# Patient Record
Sex: Female | Born: 1995 | Race: Black or African American | Hispanic: No | Marital: Single | Smoking: Former smoker
Health system: Southern US, Community
[De-identification: ages and names within clinical notes are randomized; demographics above are authoritative.]

## PROBLEM LIST (undated history)

## (undated) DIAGNOSIS — S42413A Displaced simple supracondylar fracture without intercondylar fracture of unspecified humerus, initial encounter for closed fracture: Secondary | ICD-10-CM

## (undated) DIAGNOSIS — N83209 Unspecified ovarian cyst, unspecified side: Secondary | ICD-10-CM

## (undated) HISTORY — PX: NO PAST SURGERIES: SHX2092

---
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
## 2015-02-20 ENCOUNTER — Emergency Department (HOSPITAL_BASED_OUTPATIENT_CLINIC_OR_DEPARTMENT_OTHER)
Admission: EM | Admit: 2015-02-20 | Discharge: 2015-02-20 | Disposition: A | Discharge status: Home / Self Care | Payer: Medicaid Other | Attending: Emergency Medicine | Admitting: Emergency Medicine

## 2015-02-20 ENCOUNTER — Encounter (HOSPITAL_BASED_OUTPATIENT_CLINIC_OR_DEPARTMENT_OTHER): Payer: Self-pay | Admitting: Emergency Medicine

## 2015-02-20 DIAGNOSIS — K088 Other specified disorders of teeth and supporting structures: Secondary | ICD-10-CM | POA: Diagnosis present

## 2015-02-20 DIAGNOSIS — K029 Dental caries, unspecified: Secondary | ICD-10-CM | POA: Diagnosis not present

## 2015-02-20 MED ORDER — PENICILLIN V POTASSIUM 250 MG PO TABS
500.0000 mg | ORAL_TABLET | Freq: Once | ORAL | Status: AC
  Administered 2015-02-20: 500 mg via ORAL
  Filled 2015-02-20: qty 2

## 2015-02-20 MED ORDER — PENICILLIN V POTASSIUM 500 MG PO TABS: 500.0000 mg | ORAL_TABLET | Freq: Four times a day (QID) | ORAL | Status: AC

## 2015-02-20 MED ORDER — MELOXICAM 15 MG PO TABS: 15.0000 mg | ORAL_TABLET | Freq: Every day | ORAL | Status: DC

## 2015-02-20 NOTE — ED Notes (Signed)
Pt verbalizes understanding of d/c instructions and denies any further needs at this time. 

## 2015-02-20 NOTE — Discharge Instructions (Signed)
Dental Caries °Dental caries is tooth decay. This decay can cause a hole in teeth (cavity) that can get bigger and deeper over time. °HOME CARE °· Brush and floss your teeth. Do this at least two times a day. °· Use a fluoride toothpaste. °· Use a mouth rinse if told by your dentist or doctor. °· Eat less sugary and starchy foods. Drink less sugary drinks. °· Avoid snacking often on sugary and starchy foods. Avoid sipping often on sugary drinks. °· Keep regular checkups and cleanings with your dentist. °· Use fluoride supplements if told by your dentist or doctor. °· Allow fluoride to be applied to teeth if told by your dentist or doctor. °Document Released: 02/16/2008 Document Revised: 09/23/2013 Document Reviewed: 05/11/2012 °[employer]® Patient Information ©2015 [employer]. This information is not intended to replace advice given to you by your health care provider. Make sure you discuss any questions you have with your health care provider. ° °

## 2015-02-20 NOTE — ED Provider Notes (Signed)
CSN: [id number]     Arrival date & time 02/20/15  0443 History   First MD Initiated Contact with Patient 02/20/15 [phone]     Chief Complaint  Patient presents with  . Dental Pain     (Consider location/radiation/quality/duration/timing/severity/associated sxs/prior Treatment) Patient is a 19 y.o. female presenting with tooth pain. The history is provided by the patient.  Dental Pain Location:  Lower Lower teeth location:  20/LL 2nd bicuspid Quality:  Dull Severity:  Moderate Onset quality:  Gradual Timing:  Constant Progression:  Unchanged Chronicity:  Recurrent Context: dental caries   Context comment:  Filling cracked  Previous work-up:  Filled cavity Relieved by:  Nothing Worsened by:  Nothing tried Ineffective treatments:  None tried Associated symptoms: no congestion, no fever, no neck swelling and no trismus     History reviewed. No pertinent past medical history. History reviewed. No pertinent past surgical history. Family History  Problem Relation Age of Onset  . Hyperlipidemia Mother   . Hypertension Mother   . Diabetes Maternal Grandmother   . Hyperlipidemia Maternal Grandmother   . Hypertension Maternal Grandmother   . Mental retardation Maternal Grandmother   . Hypertension Maternal Grandfather    Social History  Substance Use Topics  . Smoking status: Never Smoker   . Smokeless tobacco: None  . Alcohol Use: No   OB History    No data available     Review of Systems  Constitutional: Negative for fever.  HENT: Negative for congestion.   All other systems reviewed and are negative.     Allergies  Nickel  Home Medications   Prior to Admission medications   Medication Sig Start Date End Date Taking? Authorizing Provider  meloxicam (MOBIC) 15 MG tablet Take 1 tablet (15 mg total) by mouth daily. 02/20/15   April Palumbo, MD  penicillin v potassium (VEETID) 500 MG tablet Take 1 tablet (500 mg total) by mouth 4 (four) times daily. 02/20/15 02/27/15   April Palumbo, MD   BP 121/77 mmHg  Pulse 70  Temp(Src) 98.3 F (36.8 C) (Oral)  Resp 18  Ht  (1.676 m)  Wt 175 lb (79.379 kg)  BMI 28.26 kg/m2  SpO2 100%  LMP 02/05/2015 Physical Exam  Constitutional: She is oriented to person, place, and time. She appears well-developed and well-nourished. No distress.  HENT:  Head: Normocephalic and atraumatic.  Mouth/Throat: Oropharynx is clear and moist. No trismus in the jaw. Dental caries present.  Eyes: Conjunctivae are normal. Pupils are equal, round, and reactive to light.  Neck: Normal range of motion. Neck supple.  Cardiovascular: Normal rate, regular rhythm and intact distal pulses.   Pulmonary/Chest: Effort normal and breath sounds normal. She has no wheezes. She has no rales.  Abdominal: Soft. Bowel sounds are normal. There is no tenderness. There is no rebound and no guarding.  Musculoskeletal: Normal range of motion.  Neurological: She is alert and oriented to person, place, and time.  Skin: Skin is warm and dry.  Psychiatric: She has a normal mood and affect.    ED Course  Procedures (including critical care time) Labs Review Labs Reviewed - No data to display  Imaging Review No results found. I have personally reviewed and evaluated these images and lab results as part of my medical decision-making.   EKG Interpretation None      MDM   Final diagnoses:  Dental caries    Penicillin and mobic and follow up with your dentist for panorex and refill of tooth  Cy Blamer, MD 02/20/15 [phone]

## 2015-02-20 NOTE — ED Notes (Signed)
Sharp pains to lower left molars for the last month, getting worse over the last two days.  States her filling is broken.

## 2016-06-24 ENCOUNTER — Emergency Department (HOSPITAL_BASED_OUTPATIENT_CLINIC_OR_DEPARTMENT_OTHER): Payer: Medicaid Other

## 2016-06-24 ENCOUNTER — Encounter (HOSPITAL_BASED_OUTPATIENT_CLINIC_OR_DEPARTMENT_OTHER): Payer: Self-pay | Admitting: *Deleted

## 2016-06-24 ENCOUNTER — Emergency Department (HOSPITAL_BASED_OUTPATIENT_CLINIC_OR_DEPARTMENT_OTHER)
Admission: EM | Admit: 2016-06-24 | Discharge: 2016-06-24 | Disposition: A | Discharge status: Home / Self Care | Payer: Medicaid Other | Attending: Emergency Medicine | Admitting: Emergency Medicine

## 2016-06-24 DIAGNOSIS — S0993XA Unspecified injury of face, initial encounter: Secondary | ICD-10-CM

## 2016-06-24 DIAGNOSIS — W108XXA Fall (on) (from) other stairs and steps, initial encounter: Secondary | ICD-10-CM | POA: Insufficient documentation

## 2016-06-24 DIAGNOSIS — S0181XA Laceration without foreign body of other part of head, initial encounter: Secondary | ICD-10-CM | POA: Insufficient documentation

## 2016-06-24 DIAGNOSIS — Y929 Unspecified place or not applicable: Secondary | ICD-10-CM | POA: Insufficient documentation

## 2016-06-24 DIAGNOSIS — Y939 Activity, unspecified: Secondary | ICD-10-CM | POA: Insufficient documentation

## 2016-06-24 DIAGNOSIS — Z23 Encounter for immunization: Secondary | ICD-10-CM | POA: Insufficient documentation

## 2016-06-24 DIAGNOSIS — Y999 Unspecified external cause status: Secondary | ICD-10-CM | POA: Insufficient documentation

## 2016-06-24 DIAGNOSIS — S025XXA Fracture of tooth (traumatic), initial encounter for closed fracture: Secondary | ICD-10-CM | POA: Insufficient documentation

## 2016-06-24 MED ORDER — TETANUS-DIPHTH-ACELL PERTUSSIS 5-2.5-18.5 LF-MCG/0.5 IM SUSP
0.5000 mL | Freq: Once | INTRAMUSCULAR | Status: AC
  Administered 2016-06-24: 0.5 mL via INTRAMUSCULAR
  Filled 2016-06-24: qty 0.5

## 2016-06-24 NOTE — ED Triage Notes (Signed)
Pt reports that she fell and hit her face on concrete stairs about 1 hour PTA.  Noted to have a chipped right front tooth, denies pain.  Reports pain in left front tooth.  Noted to have a 1cm lac to forehead, bleeding controlled.  Denies LOC.

## 2016-06-24 NOTE — Discharge Instructions (Signed)
Dermabond will slough off over the next few days, try not to pull it off. Can use mederma once would is healed to help reduce scarring. Unfortunately, there is not a dentist on call today.  You can try to find another one, or wait to see your dentist on Wednesday. Return here for new concerns.

## 2016-06-24 NOTE — ED Provider Notes (Signed)
[facility] Note   CSN: [id number] Arrival date & time: 06/24/16  0848     History   Chief Complaint Chief Complaint  Patient presents with  . Fall    HPI Julia Mcknight is a 21 y.o. female.  The history is provided by the patient and medical records.  Fall    21 year old female here after fall. Reports she was going up some steps and not paying attention causing her to slip. She hit her head on the steps as well as her face. No loss of consciousness. Patient states she sustained a small laceration to her forehead and chipped her right front tooth. States her other front tooth does feel somewhat loose and she has some pain along her gums. She denies any headache, confusion, numbness, weakness, or difficulty walking. She was able to drive herself to the ER today. Date of last tetanus unknown. She is established with dentist in [place].  History reviewed. No pertinent past medical history.  There are no active problems to display for this patient.   History reviewed. No pertinent surgical history.  OB History    No data available       Home Medications    Prior to Admission medications   Not on File    Family History Family History  Problem Relation Age of Onset  . Hyperlipidemia Mother   . Hypertension Mother   . Diabetes Maternal Grandmother   . Hyperlipidemia Maternal Grandmother   . Hypertension Maternal Grandmother   . Mental retardation Maternal Grandmother   . Hypertension Maternal Grandfather     Social History Social History  Substance Use Topics  . Smoking status: Never Smoker  . Smokeless tobacco: Not on file  . Alcohol use No     Allergies   Nickel   Review of Systems Review of Systems  HENT: Positive for dental problem.   Skin: Positive for wound.  All other systems reviewed and are negative.    Physical Exam Updated Vital Signs BP 113/81 (BP Location: Left Arm)   Pulse 70   Temp 98.2 F (36.8 C)  (Oral)   Resp 16   Ht 5\' 5"  (1.651 m)   Wt 80.3 kg   LMP 06/13/2016   SpO2 100%   BMI 29.45 kg/m   Physical Exam  Constitutional: She is oriented to person, place, and time. She appears well-developed and well-nourished.  HENT:  Head: Normocephalic and atraumatic.  Right Ear: Tympanic membrane normal.  Left Ear: Tympanic membrane normal.  Nose: Nose normal.  Mouth/Throat: Oropharynx is clear and moist.  Right upper central incisor is broken in half horizontally; remaining teeth normal in appearance, some tenderness of surrounding gingiva, no bleeding, no tongue laceration, no trismus, able to open mouth 3+ finger widths  1cm superficial laceration over the central forehead, there is no bleeding, skin remains very well approximated; no surrounding hematoma or skull depression  Eyes: Conjunctivae and EOM are normal. Pupils are equal, round, and reactive to light.  Neck: Normal range of motion.  Cardiovascular: Normal rate, regular rhythm and normal heart sounds.   No murmur heard. Pulmonary/Chest: Effort normal and breath sounds normal. No respiratory distress. She has no wheezes. She has no rhonchi.  Abdominal: Soft. Bowel sounds are normal. There is no tenderness. There is no rebound.  Musculoskeletal: Normal range of motion.       Cervical back: Normal.  Neurological: She is alert and oriented to person, place, and time.  AAOx3, answering questions and  following commands appropriately; equal strength UE and LE bilaterally; CN grossly intact; moves all extremities appropriately without ataxia; no focal neuro deficits or facial asymmetry appreciated  Skin: Skin is warm and dry.  Psychiatric: She has a normal mood and affect.  Nursing note and vitals reviewed.    ED Treatments / Results  Labs (all labs ordered are listed, but only abnormal results are displayed) Labs Reviewed - No data to display  EKG  EKG Interpretation None       Radiology Ct Maxillofacial Wo  Contrast  Result Date: 06/24/2016 CLINICAL DATA:  Pt tripped and fell on stairs, broken front teeth, jaw pain EXAM: CT MAXILLOFACIAL WITHOUT CONTRAST TECHNIQUE: Multidetector CT imaging of the maxillofacial structures was performed. Multiplanar CT image reconstructions were also generated. A small metallic BB was placed on the right temple in order to reliably differentiate right from left. COMPARISON:  None. FINDINGS: Osseous: No fracture of the orbital walls. Intraconal contents are normal. No maxillary bone fracture noted. Small of fluid within the RIGHT maxillary sinus is favored inflammatory. Pterygoid plates are normal. No nasal bone fracture. The mandibular condyles are intact.  No mandibular fracture present. There is a fracture of the RIGHT upper central incisor. Orbits: Normal Sinuses: Small of fluid RIGHT maxillary sinus Soft tissues: Normal Limited intracranial: Unremarkable IMPRESSION: 1. No facial bone fracture. 2. Fractured tooth:  RIGHT upper central incisor. Electronically Signed   By: Genevive BiStewart  Edmunds M.D.   On: 06/24/2016 10:21    Procedures Procedures (including critical care time)  LACERATION REPAIR Performed by: Garlon HatchetSANDERS, Londynn Sonoda M Authorized by: Garlon HatchetSANDERS, Immaculate Crutcher M Consent: Verbal consent obtained. Risks and benefits: risks, benefits and alternatives were discussed Consent given by: patient Patient identity confirmed: provided demographic data Prepped and Draped in normal sterile fashion Wound explored  Laceration Location: central forhead  Laceration Length: 1cm  No Foreign Bodies seen or palpated  Anesthesia: none  Local anesthetic: none  Anesthetic total: 0 ml  Irrigation method: syringe Amount of cleaning: standard  Skin closure: dermabond  Number of sutures: 0  Technique: surgical glue  Patient tolerance: Patient tolerated the procedure well with no immediate complications.   Medications Ordered in ED Medications  Tdap (BOOSTRIX) injection 0.5 mL (0.5  mLs Intramuscular Given 06/24/16 1009)     Initial Impression / Assessment and Plan / ED Course  I have reviewed the triage vital signs and the nursing notes.  Pertinent labs & imaging results that were available during my care of the patient were reviewed by me and considered in my medical decision making (see chart for details).  21 y.o. F here with fall and subsequent head laceration and broken tooth.  No LOC.  Remains AAOx3 here without noted deficits.  Superficial 1cm laceration noted to forehead as well as broken right upper central incisor.  CT max/face without evidence of underlying jaw or facial fracture. Laceration repaired with dermabond.  Tetanus updated.  Will have her follow-up with dentist regarding fractured tooth.  Discussed plan with patient, she acknowledged understanding and agreed with plan of care.  Return precautions given for new or worsening symptoms.  Final Clinical Impressions(s) / ED Diagnoses   Final diagnoses:  Dental injury, initial encounter  Forehead laceration, initial encounter    New Prescriptions New Prescriptions   No medications on file     Garlon HatchetLisa M Piper Hassebrock, PA-C 06/24/16 1104    Vanetta MuldersScott Zackowski, MD 06/30/16 [phone]

## 2016-09-15 ENCOUNTER — Encounter (HOSPITAL_BASED_OUTPATIENT_CLINIC_OR_DEPARTMENT_OTHER): Payer: Self-pay | Admitting: *Deleted

## 2016-09-15 ENCOUNTER — Emergency Department (HOSPITAL_BASED_OUTPATIENT_CLINIC_OR_DEPARTMENT_OTHER)
Admission: EM | Admit: 2016-09-15 | Discharge: 2016-09-15 | Disposition: A | Discharge status: Home / Self Care | Payer: Self-pay | Attending: Emergency Medicine | Admitting: Emergency Medicine

## 2016-09-15 ENCOUNTER — Emergency Department (HOSPITAL_BASED_OUTPATIENT_CLINIC_OR_DEPARTMENT_OTHER): Payer: Self-pay

## 2016-09-15 DIAGNOSIS — R319 Hematuria, unspecified: Secondary | ICD-10-CM

## 2016-09-15 DIAGNOSIS — N83201 Unspecified ovarian cyst, right side: Secondary | ICD-10-CM | POA: Insufficient documentation

## 2016-09-15 DIAGNOSIS — N390 Urinary tract infection, site not specified: Secondary | ICD-10-CM | POA: Insufficient documentation

## 2016-09-15 LAB — COMPREHENSIVE METABOLIC PANEL
ALT: 14 U/L (ref 14–54)
ANION GAP: 6 (ref 5–15)
AST: 20 U/L (ref 15–41)
Albumin: 4 g/dL (ref 3.5–5.0)
Alkaline Phosphatase: 61 U/L (ref 38–126)
BUN: 9 mg/dL (ref 6–20)
CHLORIDE: 106 mmol/L (ref 101–111)
CO2: 25 mmol/L (ref 22–32)
Calcium: 9 mg/dL (ref 8.9–10.3)
Creatinine, Ser: 0.67 mg/dL (ref 0.44–1.00)
Glucose, Bld: 96 mg/dL (ref 65–99)
POTASSIUM: 3.8 mmol/L (ref 3.5–5.1)
SODIUM: 137 mmol/L (ref 135–145)
Total Bilirubin: 0.3 mg/dL (ref 0.3–1.2)
Total Protein: 7.4 g/dL (ref 6.5–8.1)

## 2016-09-15 LAB — URINALYSIS, ROUTINE W REFLEX MICROSCOPIC
BILIRUBIN URINE: NEGATIVE
Glucose, UA: NEGATIVE mg/dL
KETONES UR: NEGATIVE mg/dL
NITRITE: NEGATIVE
Protein, ur: NEGATIVE mg/dL
SPECIFIC GRAVITY, URINE: 1.019 (ref 1.005–1.030)
pH: 7 (ref 5.0–8.0)

## 2016-09-15 LAB — URINALYSIS, MICROSCOPIC (REFLEX)

## 2016-09-15 LAB — CBC WITH DIFFERENTIAL/PLATELET
Basophils Absolute: 0 10*3/uL (ref 0.0–0.1)
Basophils Relative: 0 %
EOS ABS: 0.1 10*3/uL (ref 0.0–0.7)
Eosinophils Relative: 1 %
HCT: 36.8 % (ref 36.0–46.0)
Hemoglobin: 13.3 g/dL (ref 12.0–15.0)
LYMPHS ABS: 2 10*3/uL (ref 0.7–4.0)
LYMPHS PCT: 31 %
MCH: 32.5 pg (ref 26.0–34.0)
MCHC: 36.1 g/dL — ABNORMAL HIGH (ref 30.0–36.0)
MCV: 90 fL (ref 78.0–100.0)
MONO ABS: 0.6 10*3/uL (ref 0.1–1.0)
Monocytes Relative: 9 %
Neutro Abs: 3.7 10*3/uL (ref 1.7–7.7)
Neutrophils Relative %: 59 %
PLATELETS: 216 10*3/uL (ref 150–400)
RBC: 4.09 MIL/uL (ref 3.87–5.11)
RDW: 11.5 % (ref 11.5–15.5)
WBC: 6.3 10*3/uL (ref 4.0–10.5)

## 2016-09-15 LAB — LIPASE, BLOOD: LIPASE: 23 U/L (ref 11–51)

## 2016-09-15 LAB — PREGNANCY, URINE: PREG TEST UR: NEGATIVE

## 2016-09-15 MED ORDER — MECLIZINE HCL 25 MG PO TABS: 25.0000 mg | ORAL_TABLET | Freq: Three times a day (TID) | ORAL | 0 refills | Status: DC | PRN

## 2016-09-15 MED ORDER — NAPROXEN 375 MG PO TABS: 375.0000 mg | ORAL_TABLET | Freq: Two times a day (BID) | ORAL | 0 refills | Status: DC

## 2016-09-15 MED ORDER — NITROFURANTOIN MONOHYD MACRO 100 MG PO CAPS: 100.0000 mg | ORAL_CAPSULE | Freq: Two times a day (BID) | ORAL | 0 refills | Status: DC

## 2016-09-15 NOTE — ED Triage Notes (Signed)
Pt c/o diffuse abd pain x 2 days  

## 2016-09-15 NOTE — Discharge Instructions (Signed)
Contact a health care provider if: °· You have back pain. °· You have a fever. °· You feel nauseous or vomit. °· Your symptoms do not get better after 3 days. °· Your symptoms go away and then return. ° °

## 2016-09-15 NOTE — ED Provider Notes (Signed)
[facility] Provider Note   CSN: [id number] Arrival date & time: 09/15/16  1558  By signing my name below, I, [name], attest that this documentation has been prepared under the direction and in the presence of non-physician practitioner, Arthor Captain, PA-C. Electronically Signed: [name], Scribe. 09/15/2016. 4:44 PM.  History   Chief Complaint Chief Complaint  Patient presents with  . Abdominal Pain   The history is provided by the patient. No language interpreter was used.   HPI Comments: Julia Mcknight is a 21 y.o. female who presents to the Emergency Department complaining of constant moderate RLQ abdominal pain that started about 2 days ago. She was diagnosed with an ovarian cyst 2 months ago. She describes the pain as a 6/10 and worse at night. She reports associated nausea, vomiting (twice this morning), upper left back pain, and breast tenderness (onset a week ago). LNMP was 08/04/16  She admits to a chance of pregnancy. Denies any urinary symptoms, diarrhea, constipation, or other complaints at this time.  History reviewed. No pertinent past medical history.  There are no active problems to display for this patient.   History reviewed. No pertinent surgical history.  OB History    No data available       Home Medications    Prior to Admission medications   Not on File    Family History Family History  Problem Relation Age of Onset  . Hyperlipidemia Mother   . Hypertension Mother   . Diabetes Maternal Grandmother   . Hyperlipidemia Maternal Grandmother   . Hypertension Maternal Grandmother   . Mental retardation Maternal Grandmother   . Hypertension Maternal Grandfather     Social History Social History  Substance Use Topics  . Smoking status: Never Smoker  . Smokeless tobacco: Not on file  . Alcohol use No     Allergies   Nickel   Review of Systems Review of Systems  All other systems reviewed and are negative for  acute change except as noted in the HPI.  Physical Exam Updated Vital Signs BP 126/90 (BP Location: Left Arm)   Pulse (!) 102   Temp 98.2 F (36.8 C)   Resp 16   Ht  (1.651 m)   Wt 178 lb (80.7 kg)   LMP 08/04/2016   SpO2 100%   BMI 29.62 kg/m   Physical Exam  Constitutional: She appears well-developed and well-nourished. No distress.  HENT:  Head: Normocephalic.  Eyes: Conjunctivae are normal.  Neck: Neck supple.  Cardiovascular: Normal rate and regular rhythm.   Pulmonary/Chest: Effort normal. No respiratory distress. She has no wheezes. She has no rales.  Abdominal: Soft. Bowel sounds are normal. There is tenderness. There is no rebound and no guarding.  SP TTP.   Genitourinary:  Genitourinary Comments: No CVA tenderness.   Musculoskeletal: Normal range of motion.  Neurological: She is alert.  Skin: Skin is warm and dry.  Psychiatric: She has a normal mood and affect.  Nursing note and vitals reviewed.    ED Treatments / Results  DIAGNOSTIC STUDIES: Oxygen Saturation is 100% on RA, normal by my interpretation.    COORDINATION OF CARE: 4:48 PM- Pt advised of plan for treatment and pt agrees.  Labs (all labs ordered are listed, but only abnormal results are displayed) Labs Reviewed  CBC WITH DIFFERENTIAL/PLATELET - Abnormal; Notable for the following:       Result Value   MCHC 36.1 (*)    All other components within normal  limits  URINALYSIS, ROUTINE W REFLEX MICROSCOPIC - Abnormal; Notable for the following:    APPearance CLOUDY (*)    Hgb urine dipstick SMALL (*)    Leukocytes, UA MODERATE (*)    All other components within normal limits  URINALYSIS, MICROSCOPIC (REFLEX) - Abnormal; Notable for the following:    Bacteria, UA FEW (*)    Squamous Epithelial / LPF 6-30 (*)    All other components within normal limits  PREGNANCY, URINE  COMPREHENSIVE METABOLIC PANEL  LIPASE, BLOOD    EKG  EKG Interpretation None       Radiology No results  found.  Procedures Procedures (including critical care time)  Medications Ordered in ED Medications - No data to display   Initial Impression / Assessment and Plan / ED Course  I have reviewed the triage vital signs and the nursing notes.  Pertinent labs & imaging results that were available during my care of the patient were reviewed by me and considered in my medical decision making (see chart for details).     Pt has been diagnosed with a UTI. Pt is afebrile, no CVA tenderness, normotensive, and denies N/V.CT negative for stone. Will send to GYN for f/u on abnormal R ovarian cyst. Pt to be dc home with antibiotics and instructions to follow up with PCP if symptoms persist.   Final Clinical Impressions(s) / ED Diagnoses   Final diagnoses:  Urinary tract infection with hematuria, site unspecified  Cyst of right ovary    New Prescriptions New Prescriptions   No medications on file   I personally performed the services described in this documentation, which was scribed in my presence. The recorded information has been reviewed and is accurate.        Arthor Captain, PA-C 09/15/16 1739    Canary Brim Tegeler, MD 09/16/16 0111

## 2017-05-18 ENCOUNTER — Emergency Department (HOSPITAL_BASED_OUTPATIENT_CLINIC_OR_DEPARTMENT_OTHER)
Admission: EM | Admit: 2017-05-18 | Discharge: 2017-05-18 | Disposition: A | Discharge status: Home / Self Care | Payer: Self-pay | Attending: Emergency Medicine | Admitting: Emergency Medicine

## 2017-05-18 ENCOUNTER — Other Ambulatory Visit: Payer: Self-pay

## 2017-05-18 ENCOUNTER — Encounter (HOSPITAL_BASED_OUTPATIENT_CLINIC_OR_DEPARTMENT_OTHER): Payer: Self-pay | Admitting: *Deleted

## 2017-05-18 ENCOUNTER — Emergency Department (HOSPITAL_BASED_OUTPATIENT_CLINIC_OR_DEPARTMENT_OTHER): Payer: Self-pay

## 2017-05-18 DIAGNOSIS — N760 Acute vaginitis: Secondary | ICD-10-CM | POA: Insufficient documentation

## 2017-05-18 DIAGNOSIS — F17200 Nicotine dependence, unspecified, uncomplicated: Secondary | ICD-10-CM | POA: Insufficient documentation

## 2017-05-18 DIAGNOSIS — B9689 Other specified bacterial agents as the cause of diseases classified elsewhere: Secondary | ICD-10-CM

## 2017-05-18 DIAGNOSIS — N83201 Unspecified ovarian cyst, right side: Secondary | ICD-10-CM

## 2017-05-18 DIAGNOSIS — N83299 Other ovarian cyst, unspecified side: Secondary | ICD-10-CM | POA: Insufficient documentation

## 2017-05-18 DIAGNOSIS — N83202 Unspecified ovarian cyst, left side: Secondary | ICD-10-CM

## 2017-05-18 LAB — URINALYSIS, ROUTINE W REFLEX MICROSCOPIC
Bilirubin Urine: NEGATIVE
GLUCOSE, UA: NEGATIVE mg/dL
Hgb urine dipstick: NEGATIVE
Ketones, ur: NEGATIVE mg/dL
LEUKOCYTES UA: NEGATIVE
NITRITE: NEGATIVE
PH: 7 (ref 5.0–8.0)
PROTEIN: NEGATIVE mg/dL
Specific Gravity, Urine: 1.015 (ref 1.005–1.030)

## 2017-05-18 LAB — CBC WITH DIFFERENTIAL/PLATELET
BASOS ABS: 0 10*3/uL (ref 0.0–0.1)
BASOS PCT: 0 %
EOS PCT: 1 %
Eosinophils Absolute: 0.1 10*3/uL (ref 0.0–0.7)
HCT: 37.4 % (ref 36.0–46.0)
Hemoglobin: 13 g/dL (ref 12.0–15.0)
Lymphocytes Relative: 21 %
Lymphs Abs: 1.5 10*3/uL (ref 0.7–4.0)
MCH: 31.5 pg (ref 26.0–34.0)
MCHC: 34.8 g/dL (ref 30.0–36.0)
MCV: 90.6 fL (ref 78.0–100.0)
MONO ABS: 0.4 10*3/uL (ref 0.1–1.0)
Monocytes Relative: 6 %
NEUTROS ABS: 4.9 10*3/uL (ref 1.7–7.7)
Neutrophils Relative %: 72 %
PLATELETS: 207 10*3/uL (ref 150–400)
RBC: 4.13 MIL/uL (ref 3.87–5.11)
RDW: 12 % (ref 11.5–15.5)
WBC: 6.9 10*3/uL (ref 4.0–10.5)

## 2017-05-18 LAB — PREGNANCY, URINE: PREG TEST UR: NEGATIVE

## 2017-05-18 LAB — COMPREHENSIVE METABOLIC PANEL
ALBUMIN: 4.2 g/dL (ref 3.5–5.0)
ALT: 17 U/L (ref 14–54)
ANION GAP: 7 (ref 5–15)
AST: 15 U/L (ref 15–41)
Alkaline Phosphatase: 72 U/L (ref 38–126)
BUN: 9 mg/dL (ref 6–20)
CHLORIDE: 105 mmol/L (ref 101–111)
CO2: 25 mmol/L (ref 22–32)
Calcium: 8.9 mg/dL (ref 8.9–10.3)
Creatinine, Ser: 0.5 mg/dL (ref 0.44–1.00)
GFR calc Af Amer: >60 mL/min (ref >60)
GFR calc non Af Amer: >60 mL/min (ref >60)
GLUCOSE: 88 mg/dL (ref 65–99)
POTASSIUM: 3.9 mmol/L (ref 3.5–5.1)
Sodium: 137 mmol/L (ref 135–145)
Total Bilirubin: 0.6 mg/dL (ref 0.3–1.2)
Total Protein: 7.4 g/dL (ref 6.5–8.1)

## 2017-05-18 LAB — WET PREP, GENITAL
SPERM: NONE SEEN
Trich, Wet Prep: NONE SEEN
Yeast Wet Prep HPF POC: NONE SEEN

## 2017-05-18 LAB — LIPASE, BLOOD: Lipase: 22 U/L (ref 11–51)

## 2017-05-18 MED ORDER — IBUPROFEN 400 MG PO TABS
600.0000 mg | ORAL_TABLET | Freq: Once | ORAL | Status: AC
  Administered 2017-05-18: 13:00:00 600 mg via ORAL
  Filled 2017-05-18: qty 1

## 2017-05-18 MED ORDER — METRONIDAZOLE 500 MG PO TABS: 500.0000 mg | ORAL_TABLET | Freq: Two times a day (BID) | ORAL | 0 refills | Status: DC

## 2017-05-18 NOTE — ED Provider Notes (Signed)
[facility] EMERGENCY DEPARTMENT Provider Note   CSN: [id number] Arrival date & time: 05/18/17  1056     History   Chief Complaint Chief Complaint  Patient presents with  . Abdominal Pain    HPI  Julia Mcknight is a 21 y.o. Female no pertinent past medical history, presents complaining of intermittent lower abdominal pain for the past month.  Patient describes pain as intermittent sharp and cramping.  Reports pain comes and goes a few times a day, but is present every day, usually worse in the morning and then sometimes returns in the afternoon.  Patient reports some associated nausea, no episodes of vomiting, no fevers or chills, no diarrhea or constipation or blood in the stool.  No history of previous gastrointestinal problems or abdominal surgeries, patient has not seen anyone for this problem so far.  Reports she came in today because pain was getting worse.  Patient took 200 mg of ibuprofen this morning with some improvement.  Pt is sexually active, denies any abnormal vaginal bleeding or discharge, no pelvic pain.  Last menstrual period was on 04/01/17.      History reviewed. No pertinent past medical history.  There are no active problems to display for this patient.   History reviewed. No pertinent surgical history.  OB History    No data available       Home Medications    Prior to Admission medications   Medication Sig Start Date End Date Taking? Authorizing Provider  meclizine (ANTIVERT) 25 MG tablet Take 1 tablet (25 mg total) by mouth 3 (three) times daily as needed for nausea. 09/15/16   Arthor Captain, PA-C  naproxen (NAPROSYN) 375 MG tablet Take 1 tablet (375 mg total) by mouth 2 (two) times daily. 09/15/16   Arthor Captain, PA-C  nitrofurantoin, macrocrystal-monohydrate, (MACROBID) 100 MG capsule Take 1 capsule (100 mg total) by mouth 2 (two) times daily. 09/15/16   Arthor Captain, PA-C    Family History Family History  Problem Relation  Age of Onset  . Hyperlipidemia Mother   . Hypertension Mother   . Diabetes Maternal Grandmother   . Hyperlipidemia Maternal Grandmother   . Hypertension Maternal Grandmother   . Mental retardation Maternal Grandmother   . Hypertension Maternal Grandfather     Social History Social History   Tobacco Use  . Smoking status: Current Every Day Smoker    Packs/day: 0.50    Types: Cigars  . Smokeless tobacco: Never Used  Substance Use Topics  . Alcohol use: No  . Drug use: No     Allergies   Nickel   Review of Systems Review of Systems  Constitutional: Negative for chills and fever.  Respiratory: Negative for cough, chest tightness and shortness of breath.   Cardiovascular: Negative for chest pain.  Gastrointestinal: Positive for abdominal pain and nausea. Negative for blood in stool, constipation, diarrhea and vomiting.  Genitourinary: Negative for dysuria, flank pain, frequency, menstrual problem, pelvic pain, vaginal bleeding, vaginal discharge and vaginal pain.  Musculoskeletal: Negative for arthralgias and myalgias.  Skin: Negative for rash.  Neurological: Negative for light-headedness and headaches.  All other systems reviewed and are negative.    Physical Exam Updated Vital Signs BP 124/85 (BP Location: Right Arm)   Temp 98.4 F (36.9 C) (Oral)   Resp 18   Ht 5\' 5"  (1.651 m)   Wt 73.5 kg (162 lb)   LMP 04/01/2017 (Exact Date)   SpO2 100%   BMI 26.96 kg/m   Physical Exam  Constitutional: She is oriented to person, place, and time. She appears well-developed and well-nourished. No distress.  HENT:  Head: Normocephalic and atraumatic.  Eyes: Right eye exhibits no discharge. Left eye exhibits no discharge.  Neck: Neck supple.  Cardiovascular: Normal rate, regular rhythm, normal heart sounds and intact distal pulses.  Pulmonary/Chest: Effort normal and breath sounds normal. No stridor. No respiratory distress. She has no wheezes. She has no rales.  Abdominal:  Soft. Bowel sounds are normal. She exhibits no distension and no mass. There is tenderness. There is no rebound and no guarding.  Abdomen soft, generalized mild tendnerness w/o guarding, worse across lower abdomen, no rebound tenderness, neg murphy's sign, neg psoas and obturator signs, no CVA tenderness.  Genitourinary:  Genitourinary Comments: Chaperone present during pelvic exam. No external genital lesions. Small amount of white discharge present in vaginal vault, no vaginal lesion or erythema. No cervicitis, minimal CMT, tenderness and fullness on palp of left adnexa, no right adnexal tenderness  Neurological: She is alert and oriented to person, place, and time. Coordination normal.  Skin: Skin is warm and dry. Capillary refill takes less than 2 seconds. She is not diaphoretic.  Psychiatric: She has a normal mood and affect. Her behavior is normal.  Nursing note and vitals reviewed.    ED Treatments / Results  Labs (all labs ordered are listed, but only abnormal results are displayed) Labs Reviewed  WET PREP, GENITAL - Abnormal; Notable for the following components:      Result Value   Clue Cells Wet Prep HPF POC PRESENT (*)    WBC, Wet Prep HPF POC MANY (*)    All other components within normal limits  PREGNANCY, URINE  CBC WITH DIFFERENTIAL/PLATELET  COMPREHENSIVE METABOLIC PANEL  LIPASE, BLOOD  URINALYSIS, ROUTINE W REFLEX MICROSCOPIC  HIV ANTIBODY (ROUTINE TESTING)  RPR  GC/CHLAMYDIA PROBE AMP ([place]) NOT AT [facility]    EKG  EKG Interpretation None       Radiology US Transvaginal Non-ob  Result Date: 05/18/2017 CLINICAL DATA:  Left adnexal tenderness. EXAM: TRANSABDOMINAL AND TRANSVAGINAL ULTRASOUND OF PELVIS DOPPLER ULTRASOUND OF OVARIES TECHNIQUE: Both transabdominal and transvaginal ultrasound examinations of the pelvis were performed. Transabdominal technique was performed for global imaging of the pelvis including uterus, ovaries, adnexal regions, and pelvic  cul-de-sac. It was necessary to proceed with endovaginal exam following the transabdominal exam to visualize the endometrium and ovaries. Color and duplex Doppler ultrasound was utilized to evaluate blood flow to the ovaries. COMPARISON:  None. FINDINGS: Uterus Measurements: 7.0 x 3.1 x 4.4 cm. No fibroids or other mass visualized. Endometrium Thickness: 7.4 mm.  No focal abnormality visualized. Right ovary Measurements: 5.2 x 4.1 x 4.5 cm. Structure within the right ovary containing diffuse low level internal echoes measures 3.6 x 4.0 x 3.6 cm. Left ovary Measurements: 8.0 x 8.2 x 8.9 cm. There are 2 complex cystic structures within the left ovary/adnexa. The largest measures 6.5 x 8.2 x 8.6 cm. This contains diffuse low-level internal echoes. Smaller, adjacent more simple appearing cystic structure measures 1.5 x 2.7 x 2.9 cm. Pulsed Doppler evaluation of both ovaries demonstrates normal low-resistance arterial and venous waveforms. Other findings Small volume of free fluid noted.  Right-sided hydrosalpinx noted. IMPRESSION: 1. There is no evidence for ovarian torsion. 2. Large complex cystic structure in the left ovary with diffuse low level internal echoes is identified. Differential considerations include hemorrhagic cyst versus an endometrioma. Short-interval follow up ultrasound in 6-12 weeks is recommended, preferably during the week  following the patient's normal menses. 3. Smaller complex cyst, also containing diffuse low level internal echoes noted within the right ovary. Similar differential. Attention on follow-up imaging advised. 4. Right-sided hydrosalpinx. Etiology indeterminate. Clinical correlation for any clinical signs or symptoms of PID is advise. Followup as indicated. 5. Small volume free fluid within the pelvis. Electronically Signed   By: Taylor  Stroud M.Signa Kell.   On: 05/18/2017 14:51   [place] Pelvis Complete  Result Date: 05/18/2017 CLINICAL DATA:  Left adnexal tenderness. EXAM: TRANSABDOMINAL  AND TRANSVAGINAL ULTRASOUND OF PELVIS DOPPLER ULTRASOUND OF OVARIES TECHNIQUE: Both transabdominal and transvaginal ultrasound examinations of the pelvis were performed. Transabdominal technique was performed for global imaging of the pelvis including uterus, ovaries, adnexal regions, and pelvic cul-de-sac. It was necessary to proceed with endovaginal exam following the transabdominal exam to visualize the endometrium and ovaries. Color and duplex Doppler ultrasound was utilized to evaluate blood flow to the ovaries. COMPARISON:  None. FINDINGS: Uterus Measurements: 7.0 x 3.1 x 4.4 cm. No fibroids or other mass visualized. Endometrium Thickness: 7.4 mm.  No focal abnormality visualized. Right ovary Measurements: 5.2 x 4.1 x 4.5 cm. Structure within the right ovary containing diffuse low level internal echoes measures 3.6 x 4.0 x 3.6 cm. Left ovary Measurements: 8.0 x 8.2 x 8.9 cm. There are 2 complex cystic structures within the left ovary/adnexa. The largest measures 6.5 x 8.2 x 8.6 cm. This contains diffuse low-level internal echoes. Smaller, adjacent more simple appearing cystic structure measures 1.5 x 2.7 x 2.9 cm. Pulsed Doppler evaluation of both ovaries demonstrates normal low-resistance arterial and venous waveforms. Other findings Small volume of free fluid noted.  Right-sided hydrosalpinx noted. IMPRESSION: 1. There is no evidence for ovarian torsion. 2. Large complex cystic structure in the left ovary with diffuse low level internal echoes is identified. Differential considerations include hemorrhagic cyst versus an endometrioma. Short-interval follow up ultrasound in 6-12 weeks is recommended, preferably during the week following the patient's normal menses. 3. Smaller complex cyst, also containing diffuse low level internal echoes noted within the right ovary. Similar differential. Attention on follow-up imaging advised. 4. Right-sided hydrosalpinx. Etiology indeterminate. Clinical correlation for any  clinical signs or symptoms of PID is advise. Followup as indicated. 5. Small volume free fluid within the pelvis. Electronically Signed   By: Signa Kellaylor  Stroud M.D.   On: 05/18/2017 14:51   [place] Art/ven Flow Abd Pelv Doppler  Result Date: 05/18/2017 CLINICAL DATA:  Left adnexal tenderness. EXAM: TRANSABDOMINAL AND TRANSVAGINAL ULTRASOUND OF PELVIS DOPPLER ULTRASOUND OF OVARIES TECHNIQUE: Both transabdominal and transvaginal ultrasound examinations of the pelvis were performed. Transabdominal technique was performed for global imaging of the pelvis including uterus, ovaries, adnexal regions, and pelvic cul-de-sac. It was necessary to proceed with endovaginal exam following the transabdominal exam to visualize the endometrium and ovaries. Color and duplex Doppler ultrasound was utilized to evaluate blood flow to the ovaries. COMPARISON:  None. FINDINGS: Uterus Measurements: 7.0 x 3.1 x 4.4 cm. No fibroids or other mass visualized. Endometrium Thickness: 7.4 mm.  No focal abnormality visualized. Right ovary Measurements: 5.2 x 4.1 x 4.5 cm. Structure within the right ovary containing diffuse low level internal echoes measures 3.6 x 4.0 x 3.6 cm. Left ovary Measurements: 8.0 x 8.2 x 8.9 cm. There are 2 complex cystic structures within the left ovary/adnexa. The largest measures 6.5 x 8.2 x 8.6 cm. This contains diffuse low-level internal echoes. Smaller, adjacent more simple appearing cystic structure measures 1.5 x 2.7 x 2.9 cm. Pulsed Doppler evaluation  of both ovaries demonstrates normal low-resistance arterial and venous waveforms. Other findings Small volume of free fluid noted.  Right-sided hydrosalpinx noted. IMPRESSION: 1. There is no evidence for ovarian torsion. 2. Large complex cystic structure in the left ovary with diffuse low level internal echoes is identified. Differential considerations include hemorrhagic cyst versus an endometrioma. Short-interval follow up ultrasound in 6-12 weeks is recommended,  preferably during the week following the patient's normal menses. 3. Smaller complex cyst, also containing diffuse low level internal echoes noted within the right ovary. Similar differential. Attention on follow-up imaging advised. 4. Right-sided hydrosalpinx. Etiology indeterminate. Clinical correlation for any clinical signs or symptoms of PID is advise. Followup as indicated. 5. Small volume free fluid within the pelvis. Electronically Signed   By: Signa Kellaylor  Stroud M.D.   On: 05/18/2017 14:51    Procedures Procedures (including critical care time)  Medications Ordered in ED Medications  ibuprofen (ADVIL,MOTRIN) tablet 600 mg (600 mg Oral Given 05/18/17 1311)     Initial Impression / Assessment and Plan / ED Course  I have reviewed the triage vital signs and the nursing notes.  Pertinent labs & imaging results that were available during my care of the patient were reviewed by me and considered in my medical decision making (see chart for details).  Pt presents complaining of 1 month of intermittent lower abdominal pain with some associated nausea, no vomiting, diarrhea or fevers. Denies vaginal pain/discharge. On exam vitals are normal and pt is well-appearing. Abdomen with generalized tenderness slightly worse across lower abdomen, no focal tenderness or peritoneal signs. Plan for labs and pelvic exam.  Negative urine preg. Labs show no leukocytosis, hgb normal, no electrolyte derangements, kidney and liver function normal, lipase normal. UA w/o signs of infection. Pelvic with small amnt of white discharge, mild CMT, left adnexal tenderness and fullness on palp, no right adnexal tenderness. Wet prep w/ clue cells, STI testing pending. Will get ultrasound given adnexal fullness.  Pelvic US shows complex left orvarian cystic structure, smaller complex cyst on right ovary, there is questionable hydrosalpinx on irght side, but pt without right sided adnexal tenderness.  Case discussed with  OB/GYN Dr. Candelaria CelesteJacob Stinson, who recommends treating for BV and having pt follow up with GYN regarding cysts. He does not feel that pt should be treated for PID after reviewing U/S report and pt presentation. He recommends waiting for GC/Chlamydia cultures to return, given lack of fever or right sided adnexal tenderness, unlikely PID.  Pt is stable for discharge home and follow up with OB/GYN, regarding ovarian cysts, [facility]. Flagyl for BV, counseled pt on avoiding alcohol while taking this medication. Pt aware she has STI testing pending and will be called with any positive results. Strict return precautions discussed, Pt expresses understanding and agrees with plan.   Final Clinical Impressions(s) / ED Diagnoses   Final diagnoses:  Bilateral ovarian cysts  BV (bacterial vaginosis)    ED Discharge Orders        Ordered    metroNIDAZOLE (FLAGYL) 500 MG tablet  2 times daily     05/18/17 1610       Dartha LodgeFord, Jenisis Harmsen N, [place]PA-C 05/18/17 2329    Dartha LodgeFord, [place] Staheli N, PA-C 05/19/17 [id number]    Raeford RazorKohut, Stephen, MD 05/19/17 [phone]

## 2017-05-18 NOTE — Discharge Instructions (Signed)
Your pain is very likely due to bilateral ovarian cysts, these need to be followed up with repeat ultrasound, please call to schedule [facility]  Wet prep shows bacterial vaginosis, this will be treated with 1 week of Flagyl, complete full course of antibiotics, do not drink alcohol while taking this.  If you develop worsening abdominal pain, fevers, chills, or worsened vaginal discharge return for sooner reevaluation. ou will be contacted in the next few days if any of your STI results come back positive.

## 2017-05-18 NOTE — ED Triage Notes (Signed)
Pt has had lower abdominal pain for about a month.  Her pain is now unbearable interfers with walking and everyday functions now.

## 2017-05-18 NOTE — ED Notes (Signed)
NAD at this time. Pt is stable and going home.  

## 2017-05-19 LAB — HIV ANTIBODY (ROUTINE TESTING W REFLEX): HIV SCREEN 4TH GENERATION: NONREACTIVE

## 2017-05-19 LAB — GC/CHLAMYDIA PROBE AMP (~~LOC~~) NOT AT ARMC
Chlamydia: NEGATIVE
NEISSERIA GONORRHEA: NEGATIVE

## 2017-05-19 LAB — SYPHILIS: RPR W/REFLEX TO RPR TITER AND TREPONEMAL ANTIBODIES, TRADITIONAL SCREENING AND DIAGNOSIS ALGORITHM: RPR Ser Ql: NONREACTIVE

## 2018-01-08 ENCOUNTER — Encounter (HOSPITAL_BASED_OUTPATIENT_CLINIC_OR_DEPARTMENT_OTHER): Payer: Self-pay | Admitting: Emergency Medicine

## 2018-01-08 ENCOUNTER — Emergency Department (HOSPITAL_BASED_OUTPATIENT_CLINIC_OR_DEPARTMENT_OTHER)
Admission: EM | Admit: 2018-01-08 | Discharge: 2018-01-08 | Disposition: A | Discharge status: Home / Self Care | Payer: Self-pay | Attending: Emergency Medicine | Admitting: Emergency Medicine

## 2018-01-08 ENCOUNTER — Other Ambulatory Visit: Payer: Self-pay

## 2018-01-08 DIAGNOSIS — Y939 Activity, unspecified: Secondary | ICD-10-CM | POA: Insufficient documentation

## 2018-01-08 DIAGNOSIS — Y929 Unspecified place or not applicable: Secondary | ICD-10-CM | POA: Insufficient documentation

## 2018-01-08 DIAGNOSIS — S91332A Puncture wound without foreign body, left foot, initial encounter: Secondary | ICD-10-CM | POA: Insufficient documentation

## 2018-01-08 DIAGNOSIS — F17210 Nicotine dependence, cigarettes, uncomplicated: Secondary | ICD-10-CM | POA: Insufficient documentation

## 2018-01-08 DIAGNOSIS — W450XXA Nail entering through skin, initial encounter: Secondary | ICD-10-CM | POA: Insufficient documentation

## 2018-01-08 DIAGNOSIS — Z23 Encounter for immunization: Secondary | ICD-10-CM | POA: Insufficient documentation

## 2018-01-08 DIAGNOSIS — Z79899 Other long term (current) drug therapy: Secondary | ICD-10-CM | POA: Insufficient documentation

## 2018-01-08 DIAGNOSIS — Y999 Unspecified external cause status: Secondary | ICD-10-CM | POA: Insufficient documentation

## 2018-01-08 MED ORDER — TETANUS-DIPHTH-ACELL PERTUSSIS 5-2.5-18.5 LF-MCG/0.5 IM SUSP
INTRAMUSCULAR | Status: AC
  Administered 2018-01-08: 0.5 mL via INTRAMUSCULAR
  Filled 2018-01-08: qty 0.5

## 2018-01-08 MED ORDER — TETANUS-DIPHTH-ACELL PERTUSSIS 5-2.5-18.5 LF-MCG/0.5 IM SUSP
0.5000 mL | Freq: Once | INTRAMUSCULAR | Status: AC
  Administered 2018-01-08: 0.5 mL via INTRAMUSCULAR

## 2018-01-08 MED ORDER — CIPROFLOXACIN HCL 750 MG PO TABS: 750.0000 mg | ORAL_TABLET | Freq: Two times a day (BID) | ORAL | 0 refills | Status: DC

## 2018-01-08 NOTE — ED Triage Notes (Signed)
Pt reports puncture wound to LT foot this am when she stepped on nail that was sticking up from a pallet board

## 2018-01-08 NOTE — Discharge Instructions (Addendum)
Thank you for allowing me to care for you today in the Emergency Department.   Take 1 tablet of ciprofloxacin 2 times daily for the next 5 days.  To care for your foot at home, keep the area clean by washing the area at least 1 time daily with warm water and soap.  You can wear a Band-Aid over the area until it heals.  Avoid soaking or submerging the foot until the wound heals completely.  Try to wear shoes and socks with breathable material to avoid sweating.  Moisture and sweat can put you at higher risk for infection.  Signs of infection include if you develop fever, chills, or if the area gets red, hot to the touch, or swollen.  You should return to the emergency department if you develop symptoms like this.  Your Tdap immunization was updated today.

## 2018-01-08 NOTE — ED Provider Notes (Signed)
[facility] EMERGENCY DEPARTMENT Provider Note   CSN: [id number] Arrival date & time: 01/08/18  0913     History   Chief Complaint Chief Complaint  Patient presents with  . Foot Pain    HPI Julia Mcknight is a 22 y.o. female with no pertinent past medical history who presents to the emergency department with a chief complaint of puncture wound.  The patient reports that she stepped on a rusty nail that went through her shoe and approximately 1 inch into the bottom of her foot this morning.  She was able to pull the nail out prior to arrival.  She has been ambulatory since the injury.  She denies numbness, weakness, fever, or chills.  No treatment prior to arrival.  The history is provided by the patient. No language interpreter was used.  Foot Pain  Pertinent negatives include no chest pain, no abdominal pain and no shortness of breath.    History reviewed. No pertinent past medical history.  There are no active problems to display for this patient.   History reviewed. No pertinent surgical history.   OB History    Gravida  0   Para  0   Term  0   Preterm  0   AB  0   Living  0     SAB  0   TAB  0   Ectopic  0   Multiple  0   Live Births  0            Home Medications    Prior to Admission medications   Medication Sig Start Date End Date Taking? Authorizing Provider  ciprofloxacin (CIPRO) 750 MG tablet Take 1 tablet (750 mg total) by mouth 2 (two) times daily. 01/08/18   Tremane Spurgeon A, PA-C  meclizine (ANTIVERT) 25 MG tablet Take 1 tablet (25 mg total) by mouth 3 (three) times daily as needed for nausea. 09/15/16   Arthor CaptainHarris, Abigail, PA-C  metroNIDAZOLE (FLAGYL) 500 MG tablet Take 1 tablet (500 mg total) by mouth 2 (two) times daily. One po bid x 7 days 05/18/17   Dartha LodgeFord, Kelsey N, PA-C  naproxen (NAPROSYN) 375 MG tablet Take 1 tablet (375 mg total) by mouth 2 (two) times daily. 09/15/16   Arthor CaptainHarris, Abigail, PA-C  nitrofurantoin,  macrocrystal-monohydrate, (MACROBID) 100 MG capsule Take 1 capsule (100 mg total) by mouth 2 (two) times daily. 09/15/16   Arthor CaptainHarris, Abigail, PA-C    Family History Family History  Problem Relation Age of Onset  . Hyperlipidemia Mother   . Hypertension Mother   . Diabetes Maternal Grandmother   . Hyperlipidemia Maternal Grandmother   . Hypertension Maternal Grandmother   . Mental retardation Maternal Grandmother   . Hypertension Maternal Grandfather     Social History Social History   Tobacco Use  . Smoking status: Current Every Day Smoker    Packs/day: 0.50    Types: Cigars  . Smokeless tobacco: Never Used  Substance Use Topics  . Alcohol use: No  . Drug use: No     Allergies   Nickel   Review of Systems Review of Systems  Constitutional: Negative for activity change, chills and fever.  Respiratory: Negative for shortness of breath.   Cardiovascular: Negative for chest pain.  Gastrointestinal: Negative for abdominal pain.  Musculoskeletal: Negative for back pain and gait problem.  Skin: Positive for wound. Negative for color change and rash.  Neurological: Negative for weakness and numbness.     Physical Exam Updated Vital  Signs Ht 5\' 5"  (1.651 m)   Wt 79.4 kg   LMP 12/26/2017   BMI 29.12 kg/m   Physical Exam  Constitutional: No distress.  HENT:  Head: Normocephalic.  Eyes: Conjunctivae are normal.  Neck: Neck supple.  Cardiovascular: Normal rate and regular rhythm. Exam reveals no gallop and no friction rub.  No murmur heard. Pulmonary/Chest: Effort normal. No respiratory distress.  Abdominal: Soft. She exhibits no distension.  Neurological: She is alert.  Skin: Skin is warm. No rash noted.  There is a pinpoint puncture wound to the plantar surface of the left forefoot.  Mildly tender to palpation.  No obvious foreign body.  No surrounding erythema, edema, or warmth.  Symmetric tandem gait.  Psychiatric: Her behavior is normal.  Nursing note and  vitals reviewed.    ED Treatments / Results  Labs (all labs ordered are listed, but only abnormal results are displayed) Labs Reviewed - No data to display  EKG None  Radiology No results found.  Procedures Procedures (including critical care time)  Medications Ordered in ED Medications  Tdap (BOOSTRIX) injection 0.5 mL (0.5 mLs Intramuscular Given 01/08/18 1004)     Initial Impression / Assessment and Plan / ED Course  I have reviewed the triage vital signs and the nursing notes.  Pertinent labs & imaging results that were available during my care of the patient were reviewed by me and considered in my medical decision making (see chart for details).     22 year old female with no pertinent past medical history presenting with a puncture wound from a rusty nail that penetrated through her shoe through the plantar surface of her left foot.  The nail was removed prior to arrival in the ED.  Tdap updated.  She has no risk factors for impaired wound healing.  Since the nail penetrated her shoe, will cover the patient prophylactically with ciprofloxacin for Pseudomonas.  Recommended Tylenol and ibuprofen for pain control.  Home wound care instructions given.  Strict return precautions to the ED given.  She is hemodynamically stable and in no acute distress.  She is safe for discharge home at this time.  Final Clinical Impressions(s) / ED Diagnoses   Final diagnoses:  Puncture wound of left foot, initial encounter    ED Discharge Orders         Ordered    ciprofloxacin (CIPRO) 750 MG tablet  2 times daily     01/08/18 1026           Tanish Prien A, PA-C 01/08/18 1035    Rolan BuccoBelfi, Melanie, MD 01/08/18 1540

## 2018-05-17 ENCOUNTER — Emergency Department (HOSPITAL_BASED_OUTPATIENT_CLINIC_OR_DEPARTMENT_OTHER): Payer: Self-pay

## 2018-05-17 ENCOUNTER — Encounter (HOSPITAL_BASED_OUTPATIENT_CLINIC_OR_DEPARTMENT_OTHER): Payer: Self-pay | Admitting: Emergency Medicine

## 2018-05-17 ENCOUNTER — Other Ambulatory Visit: Payer: Self-pay

## 2018-05-17 ENCOUNTER — Emergency Department (HOSPITAL_BASED_OUTPATIENT_CLINIC_OR_DEPARTMENT_OTHER)
Admission: EM | Admit: 2018-05-17 | Discharge: 2018-05-17 | Disposition: A | Discharge status: Home / Self Care | Payer: Self-pay | Attending: Emergency Medicine | Admitting: Emergency Medicine

## 2018-05-17 DIAGNOSIS — Z79899 Other long term (current) drug therapy: Secondary | ICD-10-CM | POA: Insufficient documentation

## 2018-05-17 DIAGNOSIS — N83202 Unspecified ovarian cyst, left side: Secondary | ICD-10-CM | POA: Insufficient documentation

## 2018-05-17 DIAGNOSIS — Z87891 Personal history of nicotine dependence: Secondary | ICD-10-CM | POA: Insufficient documentation

## 2018-05-17 DIAGNOSIS — N83201 Unspecified ovarian cyst, right side: Secondary | ICD-10-CM | POA: Insufficient documentation

## 2018-05-17 DIAGNOSIS — B9689 Other specified bacterial agents as the cause of diseases classified elsewhere: Secondary | ICD-10-CM | POA: Insufficient documentation

## 2018-05-17 DIAGNOSIS — N898 Other specified noninflammatory disorders of vagina: Secondary | ICD-10-CM | POA: Insufficient documentation

## 2018-05-17 DIAGNOSIS — N760 Acute vaginitis: Secondary | ICD-10-CM | POA: Insufficient documentation

## 2018-05-17 HISTORY — DX: Unspecified ovarian cyst, unspecified side: N83.209

## 2018-05-17 LAB — WET PREP, GENITAL
Sperm: NONE SEEN
TRICH WET PREP: NONE SEEN
YEAST WET PREP: NONE SEEN

## 2018-05-17 LAB — URINALYSIS, ROUTINE W REFLEX MICROSCOPIC
Bilirubin Urine: NEGATIVE
GLUCOSE, UA: NEGATIVE mg/dL
Hgb urine dipstick: NEGATIVE
KETONES UR: NEGATIVE mg/dL
Leukocytes, UA: NEGATIVE
Nitrite: POSITIVE — AB
PH: 6.5 (ref 5.0–8.0)
Protein, ur: NEGATIVE mg/dL
SPECIFIC GRAVITY, URINE: 1.015 (ref 1.005–1.030)

## 2018-05-17 LAB — I-STAT CHEM 8, ED
BUN: 9 mg/dL (ref 6–20)
CHLORIDE: 103 mmol/L (ref 98–111)
CREATININE: 0.6 mg/dL (ref 0.44–1.00)
Calcium, Ion: 1.19 mmol/L (ref 1.15–1.40)
GLUCOSE: 83 mg/dL (ref 70–99)
HEMATOCRIT: 37 % (ref 36.0–46.0)
HEMOGLOBIN: 12.6 g/dL (ref 12.0–15.0)
POTASSIUM: 5 mmol/L (ref 3.5–5.1)
Sodium: 141 mmol/L (ref 135–145)
TCO2: 29 mmol/L (ref 22–32)

## 2018-05-17 LAB — CBC WITH DIFFERENTIAL/PLATELET
ABS IMMATURE GRANULOCYTES: 0.03 10*3/uL (ref 0.00–0.07)
BASOS ABS: 0 10*3/uL (ref 0.0–0.1)
Basophils Relative: 0 %
Eosinophils Absolute: 0.1 10*3/uL (ref 0.0–0.5)
Eosinophils Relative: 1 %
HEMATOCRIT: 37.7 % (ref 36.0–46.0)
HEMOGLOBIN: 12.7 g/dL (ref 12.0–15.0)
IMMATURE GRANULOCYTES: 0 %
LYMPHS ABS: 2.1 10*3/uL (ref 0.7–4.0)
LYMPHS PCT: 28 %
MCH: 31.2 pg (ref 26.0–34.0)
MCHC: 33.7 g/dL (ref 30.0–36.0)
MCV: 92.6 fL (ref 80.0–100.0)
MONOS PCT: 9 %
Monocytes Absolute: 0.7 10*3/uL (ref 0.1–1.0)
NEUTROS ABS: 4.7 10*3/uL (ref 1.7–7.7)
NEUTROS PCT: 62 %
NRBC: 0 % (ref 0.0–0.2)
Platelets: 273 10*3/uL (ref 150–400)
RBC: 4.07 MIL/uL (ref 3.87–5.11)
RDW: 11.9 % (ref 11.5–15.5)
WBC: 7.6 10*3/uL (ref 4.0–10.5)

## 2018-05-17 LAB — HEPATIC FUNCTION PANEL
ALK PHOS: 61 U/L (ref 38–126)
ALT: 16 U/L (ref 0–44)
AST: 16 U/L (ref 15–41)
Albumin: 3.8 g/dL (ref 3.5–5.0)
BILIRUBIN DIRECT: 0.1 mg/dL (ref 0.0–0.2)
BILIRUBIN INDIRECT: 0.3 mg/dL (ref 0.3–0.9)
Total Bilirubin: 0.4 mg/dL (ref 0.3–1.2)
Total Protein: 7.5 g/dL (ref 6.5–8.1)

## 2018-05-17 LAB — URINALYSIS, MICROSCOPIC (REFLEX)

## 2018-05-17 LAB — PREGNANCY, URINE: Preg Test, Ur: NEGATIVE

## 2018-05-17 LAB — LIPASE, BLOOD: Lipase: 25 U/L (ref 11–51)

## 2018-05-17 MED ORDER — TINIDAZOLE 500 MG PO TABS: 1000.0000 mg | ORAL_TABLET | Freq: Every day | ORAL | 0 refills | Status: AC

## 2018-05-17 MED ORDER — IOPAMIDOL (ISOVUE-300) INJECTION 61%
100.0000 mL | Freq: Once | INTRAVENOUS | Status: AC | PRN
  Administered 2018-05-17: 100 mL via INTRAVENOUS

## 2018-05-17 NOTE — ED Notes (Signed)
Pt enrolled in aromatherapy pain trial 

## 2018-05-17 NOTE — ED Triage Notes (Signed)
L lower abd pain x 1 month, worsening over the last few days with history of ovarian cysts. Also reports vaginal spotting.

## 2018-05-17 NOTE — ED Provider Notes (Signed)
[facility] EMERGENCY DEPARTMENT Provider Note   CSN: [id number] Arrival date & time: 05/17/18  1112     History   Chief Complaint Chief Complaint  Patient presents with  . Abdominal Pain    HPI Julia Mcknight is a 22 y.o. female.  HPI  Past Medical History:  Diagnosis Date  . Ovarian cyst     There are no active problems to display for this patient.   History reviewed. No pertinent surgical history.   OB History    Gravida  0   Para  0   Term  0   Preterm  0   AB  0   Living  0     SAB  0   TAB  0   Ectopic  0   Multiple  0   Live Births  0            Home Medications    Prior to Admission medications   Medication Sig Start Date End Date Taking? Authorizing Provider  ciprofloxacin (CIPRO) 750 MG tablet Take 1 tablet (750 mg total) by mouth 2 (two) times daily. 01/08/18   McDonald, Mia A, PA-C  meclizine (ANTIVERT) 25 MG tablet Take 1 tablet (25 mg total) by mouth 3 (three) times daily as needed for nausea. 09/15/16   Arthor CaptainHarris, Abigail, PA-C  metroNIDAZOLE (FLAGYL) 500 MG tablet Take 1 tablet (500 mg total) by mouth 2 (two) times daily. One po bid x 7 days 05/18/17   Dartha LodgeFord, Kelsey N, PA-C  naproxen (NAPROSYN) 375 MG tablet Take 1 tablet (375 mg total) by mouth 2 (two) times daily. 09/15/16   Arthor CaptainHarris, Abigail, PA-C  nitrofurantoin, macrocrystal-monohydrate, (MACROBID) 100 MG capsule Take 1 capsule (100 mg total) by mouth 2 (two) times daily. 09/15/16   Arthor CaptainHarris, Abigail, PA-C    Family History Family History  Problem Relation Age of Onset  . Hyperlipidemia Mother   . Hypertension Mother   . Diabetes Maternal Grandmother   . Hyperlipidemia Maternal Grandmother   . Hypertension Maternal Grandmother   . Mental retardation Maternal Grandmother   . Hypertension Maternal Grandfather     Social History Social History   Tobacco Use  . Smoking status: Former Smoker    Packs/day: 0.50    Types: Cigars  . Smokeless tobacco: Never Used    Substance Use Topics  . Alcohol use: Yes  . Drug use: No     Allergies   Nickel   Review of Systems Review of Systems   Physical Exam Updated Vital Signs  ED Triage Vitals  Enc Vitals Group     BP 05/17/18 1126 109/75     Pulse Rate 05/17/18 1126 93     Resp 05/17/18 1126 16     Temp 05/17/18 1126 98.9 F (37.2 C)     Temp Source 05/17/18 1126 Oral     SpO2 05/17/18 1126 100 %     Weight 05/17/18 1125 172 lb (78 kg)     Height 05/17/18 1125 5\' 5"  (1.651 m)     Head Circumference --      Peak Flow --      Pain Score 05/17/18 1125 7     Pain Loc --      Pain Edu? --      Excl. in GC? --     Physical Exam   ED Treatments / Results  Labs (all labs ordered are listed, but only abnormal results are displayed) Labs Reviewed - No data to  display  EKG None  Radiology No results found.  Procedures Procedures (including critical care time)  Medications Ordered in ED Medications - No data to display   Initial Impression / Assessment and Plan / ED Course  I have reviewed the triage vital signs and the nursing notes.  Pertinent labs & imaging results that were available during my care of the patient were reviewed by me and considered in my medical decision making (see chart for details).     Julia Mcknight   Final Clinical Impressions(s) / ED Diagnoses   Final diagnoses:  None    ED Discharge Orders    None       Virgina NorfolkCuratolo, Ellyssa Zagal, DO 05/19/18 [id number]

## 2018-05-17 NOTE — ED Provider Notes (Signed)
[facility] EMERGENCY DEPARTMENT Provider Note   CSN: [id number] Arrival date & time: 05/17/18  1112     History   Chief Complaint Chief Complaint  Patient presents with  . Abdominal Pain    HPI Julia RutherfordBianaca Spare is a 22 y.o. female.  The history is provided by the patient.  Abdominal Pain   This is a chronic problem. The current episode started more than 1 week ago. The problem occurs every several days. Progression since onset: waxes and wanes. Associated with: hx of ovarian cysts. The pain is located in the RLQ and LLQ. The quality of the pain is aching and dull. The pain is at a severity of 2/10. The pain is mild. Pertinent negatives include anorexia, fever, belching, diarrhea, flatus, hematochezia, melena, vomiting, constipation, dysuria, hematuria, arthralgias and myalgias. Nothing aggravates the symptoms. Nothing relieves the symptoms. Past medical history comments: ovarian cysts.    Past Medical History:  Diagnosis Date  . Ovarian cyst     There are no active problems to display for this patient.   History reviewed. No pertinent surgical history.   OB History    Gravida  0   Para  0   Term  0   Preterm  0   AB  0   Living  0     SAB  0   TAB  0   Ectopic  0   Multiple  0   Live Births  0            Home Medications    Prior to Admission medications   Medication Sig Start Date End Date Taking? Authorizing Provider  ciprofloxacin (CIPRO) 750 MG tablet Take 1 tablet (750 mg total) by mouth 2 (two) times daily. 01/08/18   McDonald, Mia A, PA-C  meclizine (ANTIVERT) 25 MG tablet Take 1 tablet (25 mg total) by mouth 3 (three) times daily as needed for nausea. 09/15/16   Arthor CaptainHarris, Abigail, PA-C  metroNIDAZOLE (FLAGYL) 500 MG tablet Take 1 tablet (500 mg total) by mouth 2 (two) times daily. One po bid x 7 days 05/18/17   Dartha LodgeFord, Kelsey N, PA-C  naproxen (NAPROSYN) 375 MG tablet Take 1 tablet (375 mg total) by mouth 2 (two) times daily.  09/15/16   Arthor CaptainHarris, Abigail, PA-C  nitrofurantoin, macrocrystal-monohydrate, (MACROBID) 100 MG capsule Take 1 capsule (100 mg total) by mouth 2 (two) times daily. 09/15/16   Arthor CaptainHarris, Abigail, PA-C  tinidazole (TINDAMAX) 500 MG tablet Take 2 tablets (1,000 mg total) by mouth daily with breakfast for 5 days. 05/17/18 05/22/18  Virgina Norfolkuratolo, Hazleigh Mccleave, DO    Family History Family History  Problem Relation Age of Onset  . Hyperlipidemia Mother   . Hypertension Mother   . Diabetes Maternal Grandmother   . Hyperlipidemia Maternal Grandmother   . Hypertension Maternal Grandmother   . Mental retardation Maternal Grandmother   . Hypertension Maternal Grandfather     Social History Social History   Tobacco Use  . Smoking status: Former Smoker    Packs/day: 0.50    Types: Cigars  . Smokeless tobacco: Never Used  Substance Use Topics  . Alcohol use: Yes  . Drug use: No     Allergies   Nickel   Review of Systems Review of Systems  Constitutional: Negative for chills and fever.  HENT: Negative for ear pain and sore throat.   Eyes: Negative for pain and visual disturbance.  Respiratory: Negative for cough and shortness of breath.   Cardiovascular: Negative for chest  pain and palpitations.  Gastrointestinal: Positive for abdominal pain. Negative for anorexia, constipation, diarrhea, flatus, hematochezia, melena and vomiting.  Genitourinary: Positive for vaginal discharge. Negative for decreased urine volume, dysuria, hematuria, pelvic pain, vaginal bleeding and vaginal pain.  Musculoskeletal: Negative for arthralgias, back pain and myalgias.  Skin: Negative for color change and rash.  Neurological: Negative for seizures and syncope.  All other systems reviewed and are negative.    Physical Exam Updated Vital Signs  ED Triage Vitals  Enc Vitals Group     BP 05/17/18 1126 109/75     Pulse Rate 05/17/18 1126 93     Resp 05/17/18 1126 16     Temp 05/17/18 1126 98.9 F (37.2 C)     Temp  Source 05/17/18 1126 Oral     SpO2 05/17/18 1126 100 %     Weight 05/17/18 1125 172 lb (78 kg)     Height 05/17/18 1125 5\' 5"  (1.651 m)     Head Circumference --      Peak Flow --      Pain Score 05/17/18 1125 7     Pain Loc --      Pain Edu? --      Excl. in GC? --     Physical Exam Vitals signs and nursing note reviewed.  Constitutional:      General: She is not in acute distress.    Appearance: She is well-developed.  HENT:     Head: Normocephalic and atraumatic.     Mouth/Throat:     Mouth: Mucous membranes are moist.  Eyes:     Extraocular Movements: Extraocular movements intact.     Conjunctiva/sclera: Conjunctivae normal.     Pupils: Pupils are equal, round, and reactive to light.  Neck:     Musculoskeletal: Neck supple.  Cardiovascular:     Rate and Rhythm: Normal rate and regular rhythm.     Heart sounds: Normal heart sounds. No murmur.  Pulmonary:     Effort: Pulmonary effort is normal. No respiratory distress.     Breath sounds: Normal breath sounds.  Abdominal:     Palpations: Abdomen is soft.     Tenderness: There is abdominal tenderness in the right lower quadrant, periumbilical area, suprapubic area and left lower quadrant.     Hernia: No hernia is present.  Genitourinary:    Vagina: Normal.     Cervix: Discharge present. No cervical motion tenderness.     Uterus: Normal.      Adnexa: Right adnexa normal.       Right: No mass or tenderness.         Left: No mass or tenderness.       Rectum: Normal.  Skin:    General: Skin is warm and dry.     Capillary Refill: Capillary refill takes less than 2 seconds.  Neurological:     Mental Status: She is alert.      ED Treatments / Results  Labs (all labs ordered are listed, but only abnormal results are displayed) Labs Reviewed  WET PREP, GENITAL - Abnormal; Notable for the following components:      Result Value   Clue Cells Wet Prep HPF POC PRESENT (*)    WBC, Wet Prep HPF POC MANY (*)    All other  components within normal limits  URINALYSIS, ROUTINE W REFLEX MICROSCOPIC - Abnormal; Notable for the following components:   Nitrite POSITIVE (*)    All other components within normal limits  URINALYSIS,  MICROSCOPIC (REFLEX) - Abnormal; Notable for the following components:   Bacteria, UA MANY (*)    All other components within normal limits  PREGNANCY, URINE  CBC WITH DIFFERENTIAL/PLATELET  HEPATIC FUNCTION PANEL  LIPASE, BLOOD  I-STAT CHEM 8, ED  GC/CHLAMYDIA PROBE AMP ([place]) NOT AT [facility]    EKG None  Radiology US Transvaginal Non-ob  Result Date: 05/17/2018 CLINICAL DATA:  Left sided pelvic pain EXAM: TRANSABDOMINAL AND TRANSVAGINAL ULTRASOUND OF PELVIS DOPPLER ULTRASOUND OF OVARIES TECHNIQUE: Study was performed transabdominally to optimize pelvic field of view evaluation and transvaginally optimize internal visceral architecture evaluation. Color and duplex Doppler ultrasound was utilized to evaluate blood flow to the ovaries. COMPARISON:  CT abdomen pelvis May 17, 2018. Pelvic ultrasound May 18, 2017. FINDINGS: Uterus Measurements: 6.7 x 2.8 x 3.6 cm = volume: 35.1 mL. No fibroids or other mass visualized. Uterus anteverted. Endometrium Thickness: 6 mm.  No focal abnormality visualized. Right ovary Measurements: 5.8 x 3.2 x 4.1 cm = volume: 40.3 mL. There is a complex predominantly cystic mass containing septations arising in the right adnexa measuring 4.3 x 2.7 x 3.4 cm. Left ovary Measurements: 8.2 x 7.5 x 9.9 cm = volume: 313.3 mL. Note that it is difficult to separate normal ovarian tissue from the dominant mass present. There is a complex predominantly cystic mass measuring 8.0 x 7.1 x 9.5 cm. There is apparent debris and septations within this lesion. There are areas of questionable mild mural nodularity in this lesion. Pulsed Doppler evaluation of both ovaries demonstrates normal low-resistance arterial and venous waveforms. Other findings Small amount of free  fluid noted. IMPRESSION: 1. Bilateral somewhat complex predominantly cystic adnexal masses bilaterally, larger on the left than on the right as described. Note that the mass on the left was present 1 year prior and may be equivocally larger but has an overall similar appearance. Differential considerations for these masses include hemorrhagic cyst, endometriomas, and cystic ovarian neoplasms. The slight increase in size of a complex cystic mass on the left does warrant gynecologic consultation. 2. Small amount of free pelvic fluid may be indicative of recent ovarian cyst leakage. 3.  Uterus and endometrium appear unremarkable. 4.  No findings suggesting ovarian torsion. Electronically Signed   By: Bretta Bang III M.D.   On: 05/17/2018 14:28   US Pelvis Complete  Result Date: 05/17/2018 CLINICAL DATA:  Left sided pelvic pain EXAM: TRANSABDOMINAL AND TRANSVAGINAL ULTRASOUND OF PELVIS DOPPLER ULTRASOUND OF OVARIES TECHNIQUE: Study was performed transabdominally to optimize pelvic field of view evaluation and transvaginally optimize internal visceral architecture evaluation. Color and duplex Doppler ultrasound was utilized to evaluate blood flow to the ovaries. COMPARISON:  CT abdomen pelvis May 17, 2018. Pelvic ultrasound May 18, 2017. FINDINGS: Uterus Measurements: 6.7 x 2.8 x 3.6 cm = volume: 35.1 mL. No fibroids or other mass visualized. Uterus anteverted. Endometrium Thickness: 6 mm.  No focal abnormality visualized. Right ovary Measurements: 5.8 x 3.2 x 4.1 cm = volume: 40.3 mL. There is a complex predominantly cystic mass containing septations arising in the right adnexa measuring 4.3 x 2.7 x 3.4 cm. Left ovary Measurements: 8.2 x 7.5 x 9.9 cm = volume: 313.3 mL. Note that it is difficult to separate normal ovarian tissue from the dominant mass present. There is a complex predominantly cystic mass measuring 8.0 x 7.1 x 9.5 cm. There is apparent debris and septations within this lesion. There  are areas of questionable mild mural nodularity in this lesion. Pulsed Doppler evaluation of both  ovaries demonstrates normal low-resistance arterial and venous waveforms. Other findings Small amount of free fluid noted. IMPRESSION: 1. Bilateral somewhat complex predominantly cystic adnexal masses bilaterally, larger on the left than on the right as described. Note that the mass on the left was present 1 year prior and may be equivocally larger but has an overall similar appearance. Differential considerations for these masses include hemorrhagic cyst, endometriomas, and cystic ovarian neoplasms. The slight increase in size of a complex cystic mass on the left does warrant gynecologic consultation. 2. Small amount of free pelvic fluid may be indicative of recent ovarian cyst leakage. 3.  Uterus and endometrium appear unremarkable. 4.  No findings suggesting ovarian torsion. Electronically Signed   By: Bretta Bang III M.D.   On: 05/17/2018 14:28   Ct Abdomen Pelvis W Contrast  Result Date: 05/17/2018 CLINICAL DATA:  Acute generalized abdominal pain. EXAM: CT ABDOMEN AND PELVIS WITH CONTRAST TECHNIQUE: Multidetector CT imaging of the abdomen and pelvis was performed using the standard protocol following bolus administration of intravenous contrast. CONTRAST:  ISOVUE-300 IOPAMIDOL (ISOVUE-300) INJECTION 61% COMPARISON:  09/15/2016 FINDINGS: Lower chest: No acute abnormality. Hepatobiliary: Tiny hypodensity measuring 6 mm in the right hepatic lobe too small to characterize, but likely reflects a small cyst. No other focal hepatic mass. No gallstones, gallbladder wall thickening, or biliary dilatation. Pancreas: Unremarkable. No pancreatic ductal dilatation or surrounding inflammatory changes. Spleen: Normal in size without focal abnormality. Adrenals/Urinary Tract: Adrenal glands are unremarkable. Kidneys are normal, without renal calculi, focal lesion, or hydronephrosis. Diffuse bladder wall thickening  likely secondary to underdistention. Stomach/Bowel: Stomach is within normal limits. Appendix appears normal. No evidence of bowel wall thickening, distention, or inflammatory changes. Vascular/Lymphatic: No significant vascular findings are present. No enlarged abdominal or pelvic lymph nodes. Reproductive: 3.4 cm hypodense, intermediate density right ovarian mass measuring 27 Hounsfield units which may reflect a hemorrhagic cyst or endometrioma. 8.6 x 7 cm hypodense adnexal mass extending towards midline and appears to arise from the right ovary with a small amount of fluid around the right ovary and mild adjacent inflammatory changes. Other: No abdominal wall hernia.  No ascites. Musculoskeletal: No acute osseous abnormality. No aggressive osseous lesion. IMPRESSION: 1. 8.6 x 7 cm hypodense adnexal mass extending towards midline and appears to arise from the right ovary with a small amount of fluid around the right ovary and mild adjacent inflammatory changes. These findings can be seen with ovarian torsion. Recommend transvaginal ultrasound of the pelvis. 2. 3.4 cm hypodense, intermediate density right ovarian mass measuring 27 Hounsfield units which may reflect a hemorrhagic cyst or endometrioma. Electronically Signed   By: Elige Ko   On: 05/17/2018 12:33   [place] Art/ven Flow Abd Pelv Doppler  Result Date: 05/17/2018 CLINICAL DATA:  Left sided pelvic pain EXAM: TRANSABDOMINAL AND TRANSVAGINAL ULTRASOUND OF PELVIS DOPPLER ULTRASOUND OF OVARIES TECHNIQUE: Study was performed transabdominally to optimize pelvic field of view evaluation and transvaginally optimize internal visceral architecture evaluation. Color and duplex Doppler ultrasound was utilized to evaluate blood flow to the ovaries. COMPARISON:  CT abdomen pelvis May 17, 2018. Pelvic ultrasound May 18, 2017. FINDINGS: Uterus Measurements: 6.7 x 2.8 x 3.6 cm = volume: 35.1 mL. No fibroids or other mass visualized. Uterus anteverted.  Endometrium Thickness: 6 mm.  No focal abnormality visualized. Right ovary Measurements: 5.8 x 3.2 x 4.1 cm = volume: 40.3 mL. There is a complex predominantly cystic mass containing septations arising in the right adnexa measuring 4.3 x 2.7 x 3.4 cm.  Left ovary Measurements: 8.2 x 7.5 x 9.9 cm = volume: 313.3 mL. Note that it is difficult to separate normal ovarian tissue from the dominant mass present. There is a complex predominantly cystic mass measuring 8.0 x 7.1 x 9.5 cm. There is apparent debris and septations within this lesion. There are areas of questionable mild mural nodularity in this lesion. Pulsed Doppler evaluation of both ovaries demonstrates normal low-resistance arterial and venous waveforms. Other findings Small amount of free fluid noted. IMPRESSION: 1. Bilateral somewhat complex predominantly cystic adnexal masses bilaterally, larger on the left than on the right as described. Note that the mass on the left was present 1 year prior and may be equivocally larger but has an overall similar appearance. Differential considerations for these masses include hemorrhagic cyst, endometriomas, and cystic ovarian neoplasms. The slight increase in size of a complex cystic mass on the left does warrant gynecologic consultation. 2. Small amount of free pelvic fluid may be indicative of recent ovarian cyst leakage. 3.  Uterus and endometrium appear unremarkable. 4.  No findings suggesting ovarian torsion. Electronically Signed   By: Bretta BangWilliam  Woodruff III M.D.   On: 05/17/2018 14:28    Procedures Procedures (including critical care time)  Medications Ordered in ED Medications  iopamidol (ISOVUE-300) 61 % injection 100 mL (100 mLs Intravenous Contrast Given 05/17/18 1209)     Initial Impression / Assessment and Plan / ED Course  I have reviewed the triage vital signs and the nursing notes.  Pertinent labs & imaging results that were available during my care of the patient were reviewed by me and  considered in my medical decision making (see chart for details).     Julia Mcknight is a 22 year old female with history of ovarian cyst who presents to the ED with lower abdominal pain.  Patient also with vaginal discharge.  Patient with normal vital.  No fever.  Patient states that she has had lower abdominal pain for several months.  She was told that she had an ovarian cyst last year but she has not followed up with OB.  Patient with some mild tenderness in her lower abdomen.  No signs of peritonitis.  Patient is overall well-appearing.  Pelvic exam did show some discharge.  Patient not tender in the adnexa, no CMT.  Patient not concerned about STD.  We will hold off on empiric treatment.  Urinalysis showed no signs of infection.  Patient with normal lipase.  Doubt pancreatitis.  CT scan shows bilateral ovarian cyst.  Ultrasound was completed to rule out torsion.  Patient had no signs of torsion on ultrasound.  She has large complex cyst on the left that is about 8 cm which is slightly enlarged from last year, patient also with 5 cm cyst on the right side.  Clinically patient does not appear to have torsion as she is comfortable.  Has not had severe pain during this time period as well.  Patient appears to have bacterial vaginosis on wet prep.  Will treat with antibiotic.  Otherwise patient with no significant anemia, electrolyte abnormality, kidney injury.  Patient given information to follow-up with women's clinic as she needs further workup as possible neoplasm.  Given return precautions.  Patient discharged in good condition.  This chart was dictated using voice recognition software.  Despite best efforts to proofread,  errors can occur which can change the documentation meaning.  Final Clinical Impressions(s) / ED Diagnoses   Final diagnoses:  Cysts of both ovaries  BV (bacterial vaginosis)  ED Discharge Orders         Ordered    tinidazole (TINDAMAX) 500 MG tablet  Daily with  breakfast     05/17/18 1501           Virgina Norfolk, DO 05/17/18 1533

## 2018-05-17 NOTE — ED Notes (Signed)
Patient transported to CT 

## 2018-05-18 LAB — GC/CHLAMYDIA PROBE AMP (~~LOC~~) NOT AT ARMC
Chlamydia: NEGATIVE
Neisseria Gonorrhea: NEGATIVE

## 2019-01-08 ENCOUNTER — Other Ambulatory Visit: Payer: Self-pay

## 2019-01-08 DIAGNOSIS — Z20822 Contact with and (suspected) exposure to covid-19: Secondary | ICD-10-CM

## 2019-01-09 LAB — NOVEL CORONAVIRUS, NAA: SARS-CoV-2, NAA: NOT DETECTED

## 2019-01-24 ENCOUNTER — Other Ambulatory Visit: Payer: Self-pay

## 2019-01-24 DIAGNOSIS — Z20822 Contact with and (suspected) exposure to covid-19: Secondary | ICD-10-CM

## 2019-01-25 LAB — NOVEL CORONAVIRUS, NAA: SARS-CoV-2, NAA: NOT DETECTED

## 2019-06-11 ENCOUNTER — Other Ambulatory Visit: Payer: Medicaid Other

## 2019-06-13 ENCOUNTER — Other Ambulatory Visit: Payer: Self-pay

## 2019-06-13 ENCOUNTER — Ambulatory Visit: Payer: Medicaid Other | Attending: Internal Medicine

## 2019-06-13 DIAGNOSIS — Z20822 Contact with and (suspected) exposure to covid-19: Secondary | ICD-10-CM

## 2019-06-14 LAB — NOVEL CORONAVIRUS, NAA: SARS-CoV-2, NAA: NOT DETECTED

## 2019-12-20 ENCOUNTER — Ambulatory Visit: Payer: Medicaid Other | Attending: Internal Medicine

## 2019-12-20 DIAGNOSIS — Z20822 Contact with and (suspected) exposure to covid-19: Secondary | ICD-10-CM

## 2019-12-21 LAB — SARS-COV-2, NAA 2 DAY TAT

## 2019-12-21 LAB — NOVEL CORONAVIRUS, NAA: SARS-CoV-2, NAA: NOT DETECTED

## 2020-01-28 IMAGING — CT CT ABD-PELV W/ CM
2 of 4 series · 16 of 46 positions shown, 18 images · IV contrast (APPLIED)
Comparison: 09/15/2016

CLINICAL DATA: Acute generalized abdominal pain.

EXAM:
CT ABDOMEN AND PELVIS WITH CONTRAST
TECHNIQUE: Multidetector CT imaging of the abdomen and pelvis was performed
using the standard protocol following bolus administration of
intravenous contrast.
CONTRAST:  100mL HXX17R-FMM IOPAMIDOL (HXX17R-FMM) INJECTION 61%

[Series 2: axial st · axial · 0.83mm/px · z∈[-476,-41]mm · 13 of 95 slices shown, 15 images]
[im 4/95  soft-tissue]
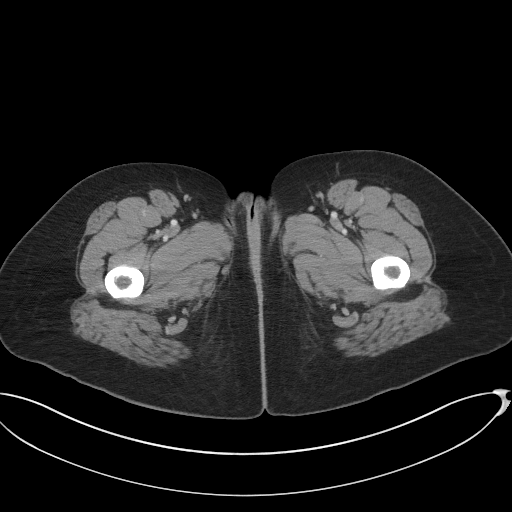
[im 4/95  bone]
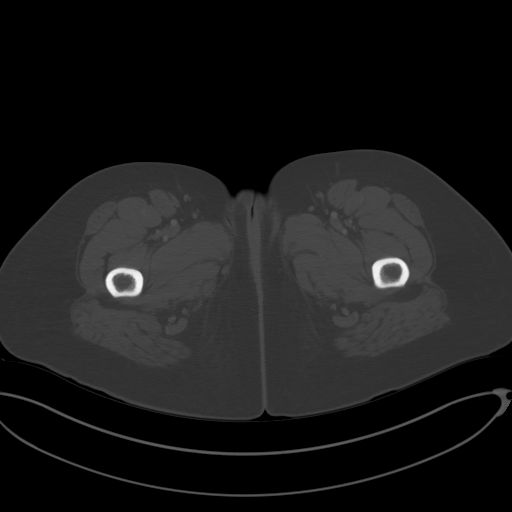
[im 12/95  soft-tissue]
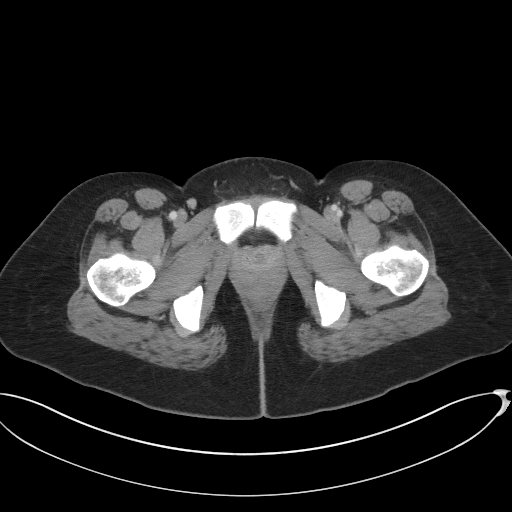
[im 19/95  soft-tissue]
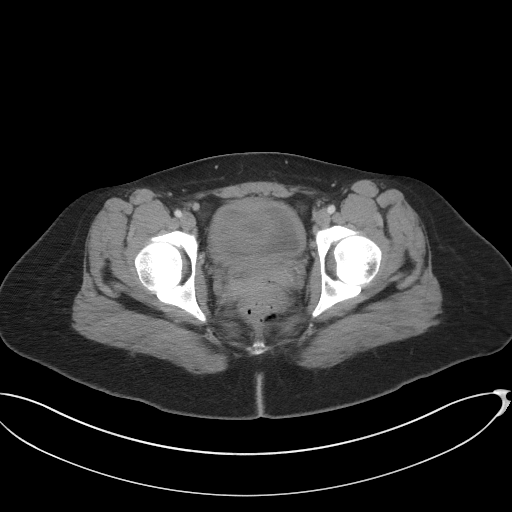
[im 27/95  soft-tissue]
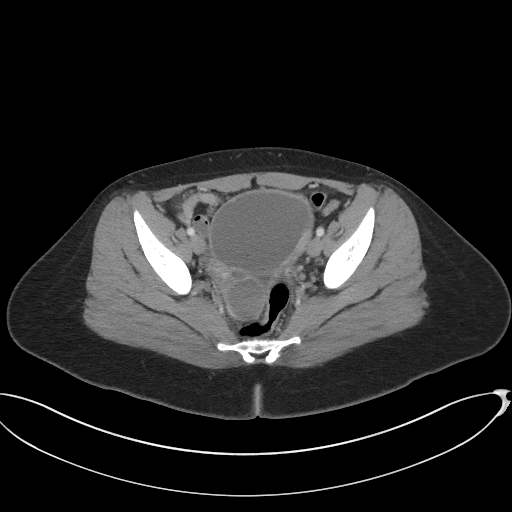
[im 34/95  soft-tissue]
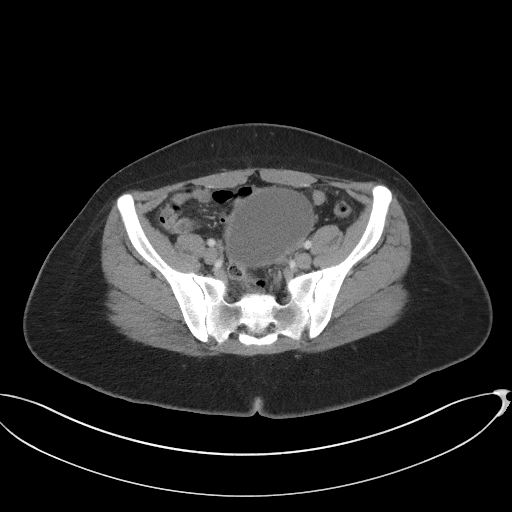
[im 42/95  soft-tissue]
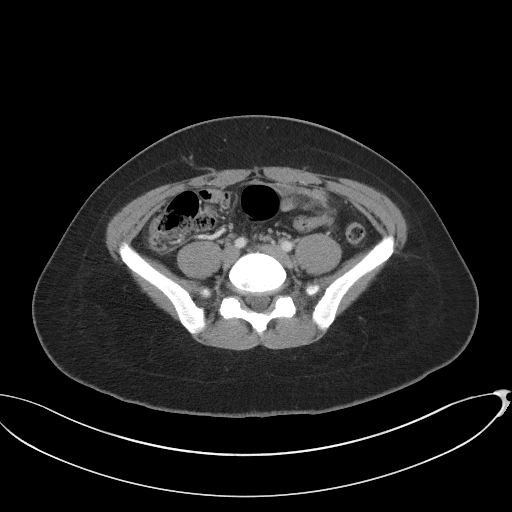
[im 49/95  soft-tissue]
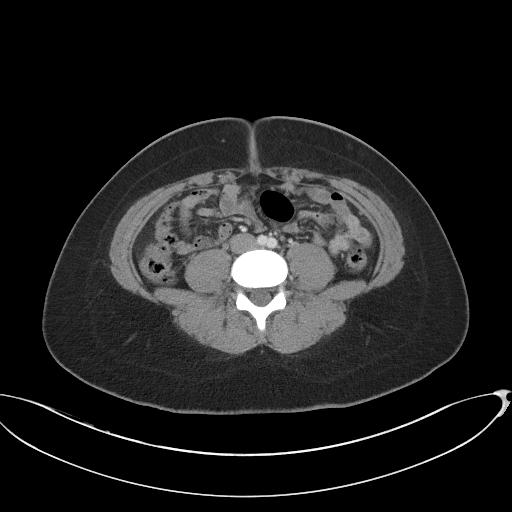
[im 53/95  soft-tissue]
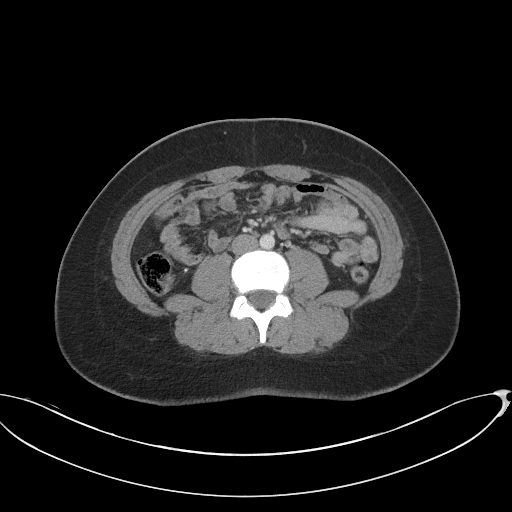
[im 61/95  soft-tissue]
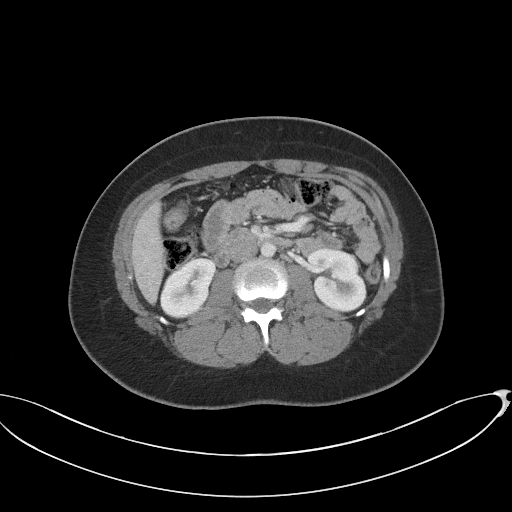
[im 61/95  bone]
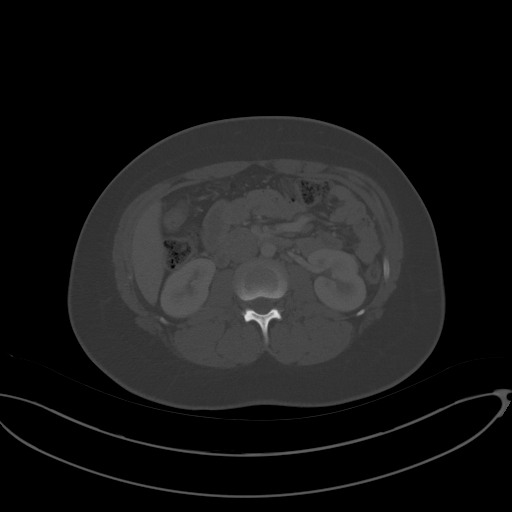
[im 68/95  soft-tissue]
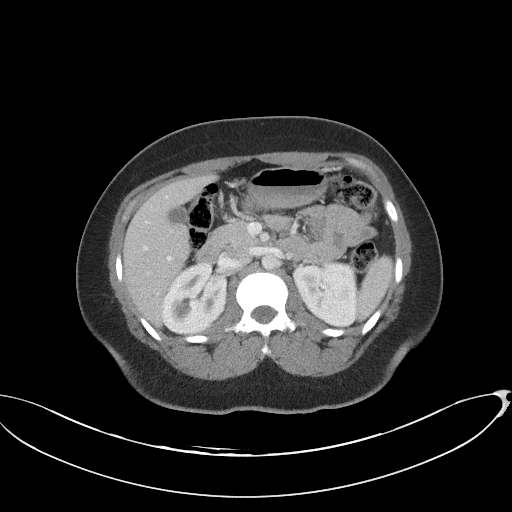
[im 76/95  soft-tissue]
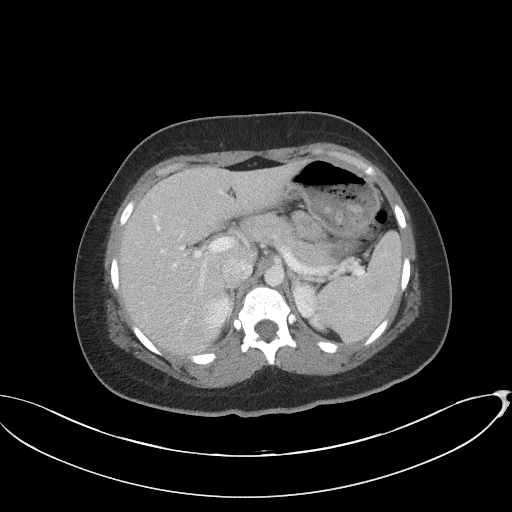
[im 83/95  soft-tissue]
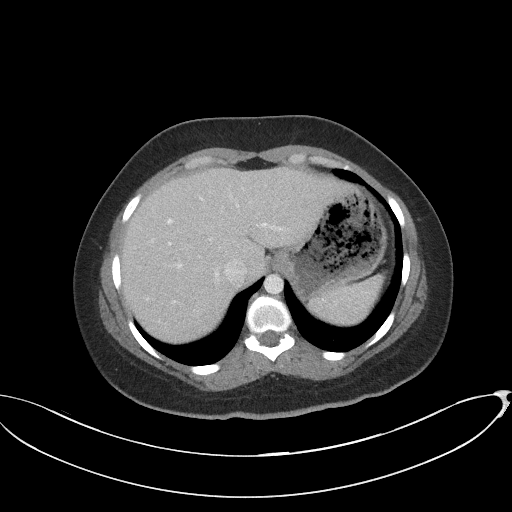
[im 91/95  soft-tissue]
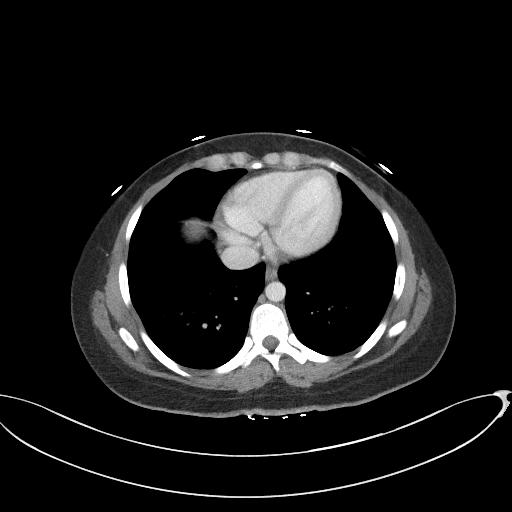

[Series 5: coronal st · coronal · 0.83mm/px · 3 of 87 slices shown]
[im 29/87  soft-tissue]
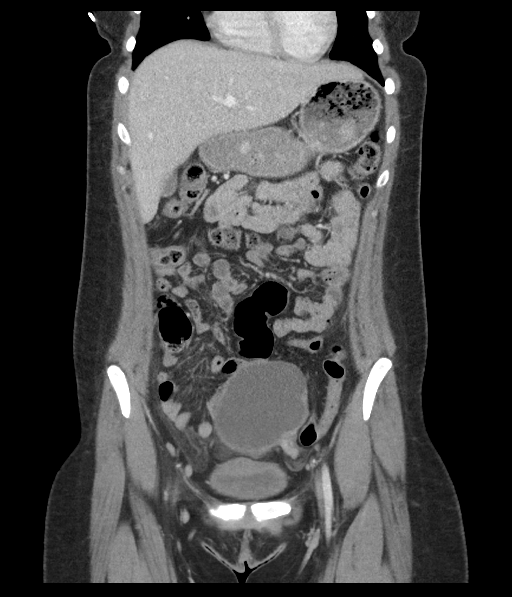
[im 39/87  soft-tissue]
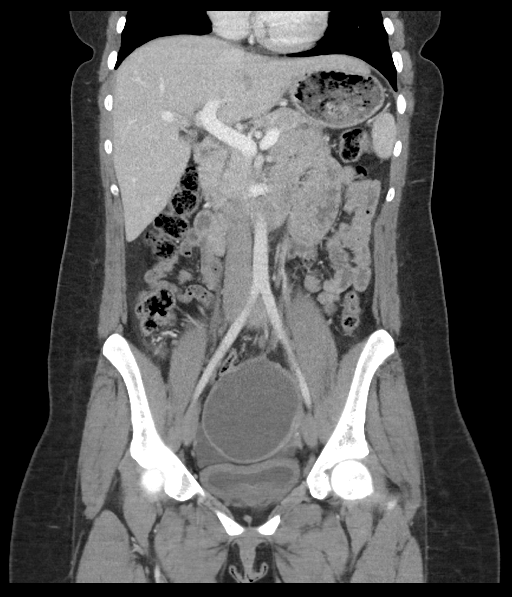
[im 48/87  soft-tissue]
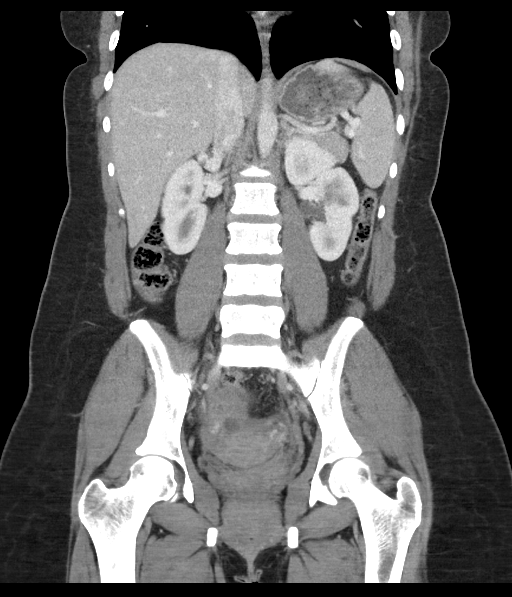

[16 of 46 positions shown; findings below may reference images not displayed]

FINDINGS: Lower chest: No acute abnormality.

Hepatobiliary: Tiny hypodensity measuring 6 mm in the right hepatic
lobe too small to characterize, but likely reflects a small cyst. No
other focal hepatic mass. No gallstones, gallbladder wall
thickening, or biliary dilatation.

Pancreas: Unremarkable. No pancreatic ductal dilatation or
surrounding inflammatory changes.

Spleen: Normal in size without focal abnormality.

Adrenals/Urinary Tract: Adrenal glands are unremarkable. Kidneys are
normal, without renal calculi, focal lesion, or hydronephrosis.
Diffuse bladder wall thickening likely secondary to underdistention.

Stomach/Bowel: Stomach is within normal limits. Appendix appears
normal. No evidence of bowel wall thickening, distention, or
inflammatory changes.

Vascular/Lymphatic: No significant vascular findings are present. No
enlarged abdominal or pelvic lymph nodes.

Reproductive: 3.4 cm hypodense, intermediate density right ovarian
mass measuring 27 Hounsfield units which may reflect a hemorrhagic
cyst or endometrioma. 8.6 x 7 cm hypodense adnexal mass extending
towards midline and appears to arise from the right ovary with a
small amount of fluid around the right ovary and mild adjacent
inflammatory changes.

Other: No abdominal wall hernia.  No ascites.

Musculoskeletal: No acute osseous abnormality. No aggressive osseous
lesion.
IMPRESSION: 1. 8.6 x 7 cm hypodense adnexal mass extending towards midline and
appears to arise from the right ovary with a small amount of fluid
around the right ovary and mild adjacent inflammatory changes. These
findings can be seen with ovarian torsion. Recommend transvaginal
ultrasound of the pelvis.
[DATE] cm hypodense, intermediate density right ovarian mass
measuring 27 Hounsfield units which may reflect a hemorrhagic cyst
or endometrioma.

## 2020-03-18 ENCOUNTER — Other Ambulatory Visit: Payer: Medicaid Other

## 2020-03-18 DIAGNOSIS — Z20822 Contact with and (suspected) exposure to covid-19: Secondary | ICD-10-CM

## 2020-03-20 LAB — NOVEL CORONAVIRUS, NAA: SARS-CoV-2, NAA: NOT DETECTED

## 2020-03-20 LAB — SARS-COV-2, NAA 2 DAY TAT

## 2020-03-23 ENCOUNTER — Other Ambulatory Visit: Payer: Medicaid Other

## 2020-03-23 DIAGNOSIS — Z20822 Contact with and (suspected) exposure to covid-19: Secondary | ICD-10-CM

## 2020-03-24 LAB — SARS-COV-2, NAA 2 DAY TAT

## 2020-03-24 LAB — NOVEL CORONAVIRUS, NAA: SARS-CoV-2, NAA: NOT DETECTED

## 2020-12-29 ENCOUNTER — Encounter (HOSPITAL_BASED_OUTPATIENT_CLINIC_OR_DEPARTMENT_OTHER): Payer: Self-pay | Admitting: Obstetrics and Gynecology

## 2020-12-29 ENCOUNTER — Other Ambulatory Visit: Payer: Self-pay

## 2021-01-04 ENCOUNTER — Other Ambulatory Visit: Payer: Self-pay | Admitting: Obstetrics and Gynecology

## 2021-01-04 NOTE — H&P (Deleted)
  The note originally documented on this encounter has been moved the the encounter in which it belongs.  

## 2021-01-04 NOTE — H&P (Signed)
Reason for Appointment  1. Preop   History of Present Illness  Isolation Precautions:  Has patient received COVID-19 vaccination? No. Does patient report new onset of COVID symptoms? No. Has patient or close contact tested positive for COVID-19? No , not in the past 2 weeks.  General:  25 yo presents for a pre-op for laparoscopic bilateral ovarian cystectomy, and possible oophorectomy planned for August 17th,2022. Pt was last seen 08/25/2020:  At this time she reported that she has gained about 11 lbs and was concerned of her weight. Pt is sexually active. She denies dyspareunia. Pt reports sxs of a UTI and requested a urinalysis at this time. She has been experiencing urinary frequency. Pelvic exam revealed LT adnexal fullness and mass, slightly tender to palpation. Pt seen by NP Tish Frederickson on Jul 27, 2020 c/o intermittent LT sided pelvic pain since 2018, though now improved. She reported pain present 1-2 week prior to menses, every other month. Pain disappears w/ onset of menses. She was prescribed Sprintec 28.  U/S on Aug 04, 2020 revealed uterus measuring 6.9 x 2.9 x 4.3 cm.  Endometrium measured 2.7 mm. RT OV contained complex cyst w/low-level internal echoes measuring 2.0 cm and avascular.  LT adnexa contained complex cyst w/low-level internal echoes measuring 13.0 cm that extends from LT and RT adnexa, possibly LT OV.  ROMA on Aug 11, 2020, revealed CA125 17.1, HE4 46.1, low likelihood of finding malignancy in surgery.  TODAY:  Discussed with pt this is a minimally invasive surgery. Discussed that with the surgery she will go home the same day. She will be unable to drive for 3 days. Advised to not lift anything over 10 lbs or have sex for 2 weeks. Discussed 3% risk of infection/bleeding/ and damage to bowel/bladder/surrounding organs was discussed. Pt advised to avoid NSAIDs (Aspirin, Aleve, Advil, Ibuprofen, Motrin) from now until surgery given risk of bleeding during surgery.    Current Medications  Taking   L-Lysine 500 MG Tablet as directed Orally   Boric Acid Vaginal 600 MG Suppository as directed Vaginal Once a week  Sprintec 28(Norgestimate-Eth Estradiol) 0.25-35 MG-MCG Tablet 1 tablet Orally Once a day  Turmeric 500 MG Capsule as directed Orally   Echinacea 500 MG Capsule as directed Orally   Discontinued   Flagyl(metroNIDAZOLE) 500 MG Tablet 1 tablet Orally twice a day  Lysine 500 MG Tablet as directed Orally   Medication List reviewed and reconciled with the patient   Past Medical History  Ovarian cyst R & L 2017.   hx of recurent BV.    Surgical History  Denies Past Surgical History   Family History  Father: alive  Mother: alive, hypertension, pre diabetic  Paternal Grand Father: deceased  Paternal Grand Mother: alive  Maternal Grand Father: alive, diagnosed with Hypertension  Maternal Grand Mother: alive, diabetes, hypertension  Brother 1: alive  Brother2: alive  Sister 1: alive  Sister 2: alive  Sister 3: alive  2 brother(s) , 4 sister(s) - healthy.   denies any GYN family cancer hx, , No Family History of Colon Cancer,.   Social History  General:  Tobacco use cigarettes: Former smoker, Quit in year 2020, Tobacco history last updated 12/22/2020, Vaping No. no Alcohol. no Caffeine. no Recreational drug use. Marital Status: single. Children: none. OCCUPATION: employed, [employer] architect.    Gyn History  Sexual activity not currently sexually active.  Periods : every month, irregular.  LMP 08/03/2020.  Denies H/O Birth control ocp.  Last pap smear date  08/2019-Normal [facility] HD.  Denies H/O Last mammogram date.  Denies H/O Abnormal pap smear no.  Denies H/O STD none.  Menarche 11.    OB History  Never been pregnant per patient.    Allergies  Nickel: rash   Hospitalization/Major Diagnostic Procedure  Denies Past Hospitalization   Review of Systems  CONSTITUTIONAL:  Chills No. Fatigue No. Fever No. Night sweats No. Recent travel  outside [place] No. Sweats No. Weight change No.  OPHTHALMOLOGY:  Blurring of vision no. Change in vision no. Double vision no.  ENT:  Dizziness no. Nose bleeds no. Sore throat no. Teeth pain no.  ALLERGY:  Hives no.  CARDIOLOGY:  Chest pain no. High blood pressure no. Irregular heart beat no. Leg edema no. Palpitations no.  RESPIRATORY:  Shortness of breath no. Cough no. Wheezing no.  UROLOGY:  Pain with urination no. Urinary urgency no. Urinary frequency no. Urinary incontinence no. Difficulty urinating No. Blood in urine No.  GASTROENTEROLOGY:  Abdominal pain no. Appetite change no. Bloating/belching no. Blood in stool or on toilet paper no. Change in bowel movements no. Constipation no. Diarrhea no. Difficulty swallowing no. Nausea no.  FEMALE REPRODUCTIVE:  Vulvar pain no. Vulvar rash no. Abnormal vaginal bleeding no. Breast pain no. Nipple discharge no. Pain with intercourse no. Pelvic pain no. Unusual vaginal discharge no. Vaginal itching no.  MUSCULOSKELETAL:  Muscle aches no.  NEUROLOGY:  Headache no. Tingling/numbness no. Weakness no.  PSYCHOLOGY:  Depression no. Anxiety no. Nervousness no. Sleep disturbances no. Suicidal ideation no .  ENDOCRINOLOGY:  Excessive thirst no. Excessive urination no. Hair loss no. Heat or cold intolerance no.  HEMATOLOGY/LYMPH:  Abnormal bleeding no. Easy bruising no. Swollen glands no.  DERMATOLOGY:  New/changing skin lesion no. Rash no. Sores no.    Vital Signs  Wt 224.6, Wt change 15.6 lb, Ht 65, BMI 37.37, Pulse sitting 105, BP sitting 126/76.   Examination  General Examination: CONSTITUTIONAL: alert, oriented, NAD. SKIN: moist, warm. EYES: Conjunctiva clear. LUNGS: good I:E efffort noted, clear to auscultation bilaterally. HEART: heart sounds are normal, rhythm is regular, no murmur. ABDOMEN: soft, non-tender/non-distended, bowel sounds present. FEMALE GENITOURINARY: normal external genitalia, labia - unremarkable, vagina - pink moist mucosa,  no lesions, bloody thin discharge in vaginal vault, cervix - no discharge or lesions or CMT, adnexa - bilateral adnexal fullness, uterus - nontender and normal size on palpation. EXTREMITIES: no edema present. PSYCH: affect normal, good eye contact.     Physical Examination  Chaperone present:  Chaperone present Plata,Menda 12/22/2020 10:53:51 AM > , for pelvic exam.  Pt aware of scribe services today.   Assessments     1. Adnexal mass - N94.9 (Primary)   2. Vaginal discharge - N89.8   Treatment  1. Adnexal mass  Notes: 2 cm mass on the right and 13 cm on the left, plan for laparoscopic bilateral ovarian cystectomy, and possible oophorectomy on August 17th,2022. Discussed with pt this is a minimally invasive surgery. Discussed that with the surgery she will go home the same day. She will be unable to drive for 3 days. Advised to not lift anything over 10 lbs or have sex for 2 weeks. risk of infection/bleeding/ and damage to bowel/bladder/surrounding organs was discussed. Pt advised to avoid NSAIDs (Aspirin, Aleve, Advil, Ibuprofen, Motrin) from now until surgery given risk of bleeding during surgery.    2. Vaginal discharge  LAB: [facility] LAB: GC/Chlam/Trich, NAA, Cepheid (ECL) Notes: Pt reports she has been experiencing vaginal discharge. Thin bloody  discharge in the vaginal vault.    Procedures  Scribe Documentation:  Attestation: I personally scribed for Dr. Richardson Dopp on the date of this appointment. Electronically signed by scribe , Venita Sheffield 12/22/2020 10:52:49 AM > .        Labs    Lab: [facility]   Value Reference Range  WBCS Normal  Normal -   CLUE CELLS Moderate A None seen -   TRICHOMONAS None Seen  None Seen -   YEAST None seen  None seen -   OTHER: RBC's present  -    Oda Lansdowne 12/22/2020 06:37:31 PM > pt has bacterial vaginosis .. please call in flagyl 500 mg sig 1 po bid #14 no refill.Marland Kitchen avoid drinking alcohol while taking this medication. Plata,Menda 12/23/2020  09:14:21 AM > Pt informed. Rx sent.          Lab: GC/Chlam/Trich, NAA, Cepheid (ECL) Negative   Value Reference Range  GC NOT DETECTED  NOT DETECTED -   Chlam NOT DETECTED  NOT DETECTED -   Trich NOT DETECTED  NOT DETECTED -    Maleia Weems 12/22/2020 06:37:31 PM > pt has bacterial vaginosis .. please call in flagyl 500 mg sig 1 po bid #14 no refill.Marland Kitchen avoid drinking alcohol while taking this medication. Plata,Menda 12/23/2020 09:14:05 AM > pt informed. Rx sent

## 2021-01-05 ENCOUNTER — Encounter (HOSPITAL_BASED_OUTPATIENT_CLINIC_OR_DEPARTMENT_OTHER)
Admission: RE | Admit: 2021-01-05 | Discharge: 2021-01-05 | Disposition: A | Discharge status: Home / Self Care | Payer: 59 | Source: Ambulatory Visit | Attending: Obstetrics and Gynecology | Admitting: Obstetrics and Gynecology

## 2021-01-05 DIAGNOSIS — N898 Other specified noninflammatory disorders of vagina: Secondary | ICD-10-CM | POA: Diagnosis present

## 2021-01-05 DIAGNOSIS — N83202 Unspecified ovarian cyst, left side: Secondary | ICD-10-CM | POA: Diagnosis not present

## 2021-01-05 DIAGNOSIS — Z01812 Encounter for preprocedural laboratory examination: Secondary | ICD-10-CM | POA: Diagnosis present

## 2021-01-05 DIAGNOSIS — Z87891 Personal history of nicotine dependence: Secondary | ICD-10-CM | POA: Diagnosis not present

## 2021-01-05 DIAGNOSIS — N83201 Unspecified ovarian cyst, right side: Secondary | ICD-10-CM | POA: Diagnosis present

## 2021-01-05 LAB — CBC
HCT: 39.3 % (ref 36.0–46.0)
Hemoglobin: 13.8 g/dL (ref 12.0–15.0)
MCH: 31.2 pg (ref 26.0–34.0)
MCHC: 35.1 g/dL (ref 30.0–36.0)
MCV: 88.7 fL (ref 80.0–100.0)
Platelets: 277 10*3/uL (ref 150–400)
RBC: 4.43 MIL/uL (ref 3.87–5.11)
RDW: 11.3 % — ABNORMAL LOW (ref 11.5–15.5)
WBC: 6.2 10*3/uL (ref 4.0–10.5)
nRBC: 0 % (ref 0.0–0.2)

## 2021-01-05 LAB — TYPE AND SCREEN
ABO/RH(D): O POS
Antibody Screen: NEGATIVE

## 2021-01-05 LAB — POCT PREGNANCY, URINE: Preg Test, Ur: NEGATIVE

## 2021-01-05 NOTE — Progress Notes (Signed)
Left message reminding pt to come in for lab work.  

## 2021-01-06 ENCOUNTER — Other Ambulatory Visit: Payer: Self-pay

## 2021-01-06 ENCOUNTER — Ambulatory Visit (HOSPITAL_BASED_OUTPATIENT_CLINIC_OR_DEPARTMENT_OTHER)
Admission: RE | Admit: 2021-01-06 | Discharge: 2021-01-06 | Disposition: A | Discharge status: Home / Self Care | Payer: PRIVATE HEALTH INSURANCE | Attending: Obstetrics and Gynecology | Admitting: Obstetrics and Gynecology

## 2021-01-06 ENCOUNTER — Encounter (HOSPITAL_BASED_OUTPATIENT_CLINIC_OR_DEPARTMENT_OTHER): Payer: Self-pay | Admitting: Obstetrics and Gynecology

## 2021-01-06 ENCOUNTER — Ambulatory Visit (HOSPITAL_BASED_OUTPATIENT_CLINIC_OR_DEPARTMENT_OTHER): Payer: PRIVATE HEALTH INSURANCE | Admitting: Certified Registered"

## 2021-01-06 ENCOUNTER — Encounter (HOSPITAL_BASED_OUTPATIENT_CLINIC_OR_DEPARTMENT_OTHER)
Admission: RE | Disposition: A | Discharge status: Home / Self Care | Payer: Self-pay | Source: Home / Self Care | Attending: Obstetrics and Gynecology

## 2021-01-06 DIAGNOSIS — Z87891 Personal history of nicotine dependence: Secondary | ICD-10-CM | POA: Insufficient documentation

## 2021-01-06 DIAGNOSIS — N83202 Unspecified ovarian cyst, left side: Secondary | ICD-10-CM | POA: Diagnosis not present

## 2021-01-06 DIAGNOSIS — N83201 Unspecified ovarian cyst, right side: Secondary | ICD-10-CM | POA: Insufficient documentation

## 2021-01-06 DIAGNOSIS — N898 Other specified noninflammatory disorders of vagina: Secondary | ICD-10-CM | POA: Insufficient documentation

## 2021-01-06 HISTORY — PX: LAPAROSCOPY: SHX197

## 2021-01-06 LAB — ABO/RH: ABO/RH(D): O POS

## 2021-01-06 SURGERY — LAPAROSCOPY, DIAGNOSTIC
Anesthesia: General | Site: Abdomen | Laterality: Bilateral

## 2021-01-06 MED ORDER — BUPIVACAINE HCL (PF) 0.25 % IJ SOLN
INTRAMUSCULAR | Status: AC
  Filled 2021-01-06: qty 60

## 2021-01-06 MED ORDER — POVIDONE-IODINE 10 % EX SWAB
2.0000 | Freq: Once | CUTANEOUS | Status: AC
Start: 2021-01-06 — End: 2021-01-06
  Administered 2021-01-06: 2 via TOPICAL

## 2021-01-06 MED ORDER — BUPIVACAINE HCL (PF) 0.25 % IJ SOLN
INTRAMUSCULAR | Status: DC | PRN
  Administered 2021-01-06: 30 mL

## 2021-01-06 MED ORDER — OXYCODONE HCL 5 MG/5ML PO SOLN: 5.0000 mg | Freq: Once | ORAL | Status: DC | PRN

## 2021-01-06 MED ORDER — FENTANYL CITRATE (PF) 100 MCG/2ML IJ SOLN
INTRAMUSCULAR | Status: AC
  Filled 2021-01-06: qty 2

## 2021-01-06 MED ORDER — SCOPOLAMINE 1 MG/3DAYS TD PT72
MEDICATED_PATCH | TRANSDERMAL | Status: AC
  Filled 2021-01-06: qty 1

## 2021-01-06 MED ORDER — DEXAMETHASONE SODIUM PHOSPHATE 4 MG/ML IJ SOLN
INTRAMUSCULAR | Status: DC | PRN
Start: 2021-01-06 — End: 2021-01-06
  Administered 2021-01-06: 5 mg via INTRAVENOUS

## 2021-01-06 MED ORDER — ONDANSETRON HCL 4 MG/2ML IJ SOLN: 4.0000 mg | Freq: Once | INTRAMUSCULAR | Status: DC | PRN

## 2021-01-06 MED ORDER — ACETAMINOPHEN 500 MG PO TABS
1000.0000 mg | ORAL_TABLET | ORAL | Status: AC
  Administered 2021-01-06: 1000 mg via ORAL

## 2021-01-06 MED ORDER — LACTATED RINGERS IV SOLN: INTRAVENOUS | Status: DC

## 2021-01-06 MED ORDER — SUGAMMADEX SODIUM 500 MG/5ML IV SOLN
INTRAVENOUS | Status: AC
  Filled 2021-01-06: qty 5

## 2021-01-06 MED ORDER — DEXMEDETOMIDINE (PRECEDEX) IN NS 20 MCG/5ML (4 MCG/ML) IV SYRINGE
PREFILLED_SYRINGE | INTRAVENOUS | Status: AC
  Filled 2021-01-06: qty 10

## 2021-01-06 MED ORDER — LIDOCAINE HCL (CARDIAC) PF 100 MG/5ML IV SOSY
PREFILLED_SYRINGE | INTRAVENOUS | Status: DC | PRN
  Administered 2021-01-06: 50 mg via INTRAVENOUS

## 2021-01-06 MED ORDER — FENTANYL CITRATE (PF) 100 MCG/2ML IJ SOLN
INTRAMUSCULAR | Status: DC | PRN
  Administered 2021-01-06 (×3): 50 ug via INTRAVENOUS

## 2021-01-06 MED ORDER — SODIUM CHLORIDE 0.9 % IV SOLN
INTRAVENOUS | Status: AC
  Filled 2021-01-06: qty 2

## 2021-01-06 MED ORDER — HYDROMORPHONE HCL 1 MG/ML IJ SOLN
INTRAMUSCULAR | Status: AC
  Filled 2021-01-06: qty 0.5

## 2021-01-06 MED ORDER — SODIUM CHLORIDE 0.9 % IV SOLN
2.0000 g | INTRAVENOUS | Status: AC
  Administered 2021-01-06: 2 g via INTRAVENOUS

## 2021-01-06 MED ORDER — CALCIUM CHLORIDE 10 % IV SOLN
INTRAVENOUS | Status: AC
  Filled 2021-01-06: qty 10

## 2021-01-06 MED ORDER — PROPOFOL 10 MG/ML IV BOLUS
INTRAVENOUS | Status: DC | PRN
  Administered 2021-01-06: 200 mg via INTRAVENOUS

## 2021-01-06 MED ORDER — HYDROMORPHONE HCL 1 MG/ML IJ SOLN
0.2500 mg | INTRAMUSCULAR | Status: DC | PRN
  Administered 2021-01-06 (×2): 0.5 mg via INTRAVENOUS

## 2021-01-06 MED ORDER — ACETAMINOPHEN 500 MG PO TABS
ORAL_TABLET | ORAL | Status: AC
  Filled 2021-01-06: qty 2

## 2021-01-06 MED ORDER — SODIUM CHLORIDE 0.9 % IR SOLN
Status: DC | PRN
  Administered 2021-01-06: 2000 mL

## 2021-01-06 MED ORDER — SUGAMMADEX SODIUM 200 MG/2ML IV SOLN
INTRAVENOUS | Status: DC | PRN
  Administered 2021-01-06: 200 mg via INTRAVENOUS

## 2021-01-06 MED ORDER — SCOPOLAMINE 1 MG/3DAYS TD PT72
1.0000 | MEDICATED_PATCH | TRANSDERMAL | Status: DC
  Administered 2021-01-06: 1.5 mg via TRANSDERMAL

## 2021-01-06 MED ORDER — OXYCODONE HCL 5 MG PO TABS: 5.0000 mg | ORAL_TABLET | Freq: Once | ORAL | Status: DC | PRN

## 2021-01-06 MED ORDER — ESMOLOL HCL 100 MG/10ML IV SOLN
INTRAVENOUS | Status: AC
  Filled 2021-01-06: qty 10

## 2021-01-06 MED ORDER — IBUPROFEN 800 MG PO TABS: 800.0000 mg | ORAL_TABLET | Freq: Three times a day (TID) | ORAL | 0 refills | Status: DC | PRN

## 2021-01-06 MED ORDER — MIDAZOLAM HCL 2 MG/2ML IJ SOLN
INTRAMUSCULAR | Status: AC
  Filled 2021-01-06: qty 2

## 2021-01-06 MED ORDER — ONDANSETRON HCL 4 MG/2ML IJ SOLN
INTRAMUSCULAR | Status: DC | PRN
  Administered 2021-01-06: 4 mg via INTRAVENOUS

## 2021-01-06 MED ORDER — OXYCODONE HCL 5 MG PO TABS: 5.0000 mg | ORAL_TABLET | Freq: Four times a day (QID) | ORAL | 0 refills | Status: DC | PRN

## 2021-01-06 MED ORDER — MIDAZOLAM HCL 5 MG/5ML IJ SOLN
INTRAMUSCULAR | Status: DC | PRN
Start: 2021-01-06 — End: 2021-01-06
  Administered 2021-01-06: 2 mg via INTRAVENOUS

## 2021-01-06 MED ORDER — ROCURONIUM BROMIDE 100 MG/10ML IV SOLN
INTRAVENOUS | Status: DC | PRN
  Administered 2021-01-06: 60 mg via INTRAVENOUS

## 2021-01-06 SURGICAL SUPPLY — 34 items
APL SRG 38 LTWT LNG FL B (MISCELLANEOUS) ×1
APPLICATOR ARISTA FLEXITIP XL (MISCELLANEOUS) ×2 IMPLANT
BAG SPEC RTRVL LRG 6X4 10 (ENDOMECHANICALS) ×1
CABLE HIGH FREQUENCY MONO STRZ (ELECTRODE) IMPLANT
DILATOR CANAL MILEX (MISCELLANEOUS) IMPLANT
DRSG OPSITE POSTOP 3X4 (GAUZE/BANDAGES/DRESSINGS) IMPLANT
GLOVE SURG ENC TEXT LTX SZ6.5 (GLOVE) ×4 IMPLANT
GLOVE SURG UNDER POLY LF SZ6.5 (GLOVE) ×6 IMPLANT
GLOVE SURG UNDER POLY LF SZ7 (GLOVE) ×4 IMPLANT
GOWN STRL REUS W/ TWL LRG LVL3 (GOWN DISPOSABLE) ×3 IMPLANT
GOWN STRL REUS W/TWL LRG LVL3 (GOWN DISPOSABLE) ×6
HEMOSTAT ARISTA ABSORB 3G PWDR (HEMOSTASIS) ×2 IMPLANT
IRRIG SUCT STRYKERFLOW 2 WTIP (MISCELLANEOUS) ×2
IRRIGATION SUCT STRKRFLW 2 WTP (MISCELLANEOUS) ×1 IMPLANT
KIT TURNOVER KIT B (KITS) ×2 IMPLANT
NS IRRIG 1000ML POUR BTL (IV SOLUTION) ×2 IMPLANT
PACK LAPAROSCOPY BASIN (CUSTOM PROCEDURE TRAY) ×2 IMPLANT
PACK TRENDGUARD 450 HYBRID PRO (MISCELLANEOUS) ×1 IMPLANT
POUCH SPECIMEN RETRIEVAL 10MM (ENDOMECHANICALS) ×2 IMPLANT
SEALER TISSUE G2 CVD JAW 35 (ENDOMECHANICALS) IMPLANT
SEALER TISSUE G2 CVD JAW 45CM (ENDOMECHANICALS)
SET TUBE SMOKE EVAC HIGH FLOW (TUBING) ×2 IMPLANT
SHEARS HARMONIC ACE PLUS 36CM (ENDOMECHANICALS) ×2 IMPLANT
SLEEVE ENDOPATH XCEL 5M (ENDOMECHANICALS) ×2 IMPLANT
SOLUTION ELECTROLUBE (MISCELLANEOUS) ×2 IMPLANT
SUT VIC AB 4-0 PS2 27 (SUTURE) ×2 IMPLANT
SUT VICRYL 0 UR6 27IN ABS (SUTURE) ×2 IMPLANT
TOWEL GREEN STERILE FF (TOWEL DISPOSABLE) ×4 IMPLANT
TRAY FOL W/BAG SLVR 16FR STRL (SET/KITS/TRAYS/PACK) ×1 IMPLANT
TRAY FOLEY W/BAG SLVR 16FR LF (SET/KITS/TRAYS/PACK) ×2
TRENDGUARD 450 HYBRID PRO PACK (MISCELLANEOUS) ×2
TROCAR XCEL NON-BLD 11X100MML (ENDOMECHANICALS) ×2 IMPLANT
TROCAR XCEL NON-BLD 5MMX100MML (ENDOMECHANICALS) ×2 IMPLANT
WARMER LAPAROSCOPE (MISCELLANEOUS) ×2 IMPLANT

## 2021-01-06 NOTE — H&P (Signed)
Date of Initial H&P: 01/04/2021  History reviewed, patient examined, no change in status, stable for surgery.  

## 2021-01-06 NOTE — Transfer of Care (Signed)
Immediate Anesthesia Transfer of Care Note  Patient: Julia Mcknight  Procedure(s) Performed: DIAGNOSTIC LAPAROSCOPY, BILATERAL PORTION OF OVARIAN CYSTECTOMY (Bilateral: Abdomen)  Patient Location: PACU  Anesthesia Type:General  Level of Consciousness: awake, alert , oriented and patient cooperative  Airway & Oxygen Therapy: Patient Spontanous Breathing and Patient connected to face mask oxygen  Post-op Assessment: Report given to RN and Post -op Vital signs reviewed and stable  Post vital signs: Reviewed and stable  Last Vitals:  Vitals Value Taken Time  BP 122/78 01/06/21 1524  Temp    Pulse 83 01/06/21 1525  Resp 21 01/06/21 1525  SpO2 99 % 01/06/21 1525  Vitals shown include unvalidated device data.  Last Pain:  Vitals:   01/06/21 1111  TempSrc: Oral  PainSc: 0-No pain      Patients Stated Pain Goal: 1 (01/06/21 1111)  Complications: No notable events documented.

## 2021-01-06 NOTE — Discharge Instructions (Signed)
May take Tylenol after 5pm, if needed.    Post Anesthesia Home Care Instructions  Activity: Get plenty of rest for the remainder of the day. A responsible individual must stay with you for 24 hours following the procedure.  For the next 24 hours, DO NOT: -Drive a car -Operate machinery -Drink alcoholic beverages -Take any medication unless instructed by your physician -Make any legal decisions or sign important papers.  Meals: Start with liquid foods such as gelatin or soup. Progress to regular foods as tolerated. Avoid greasy, spicy, heavy foods. If nausea and/or vomiting occur, drink only clear liquids until the nausea and/or vomiting subsides. Call your physician if vomiting continues.  Special Instructions/Symptoms: Your throat may feel dry or sore from the anesthesia or the breathing tube placed in your throat during surgery. If this causes discomfort, gargle with warm salt water. The discomfort should disappear within 24 hours.  If you had a scopolamine patch placed behind your ear for the management of post- operative nausea and/or vomiting:  1. The medication in the patch is effective for 72 hours, after which it should be removed.  Wrap patch in a tissue and discard in the trash. Wash hands thoroughly with soap and water. 2. You may remove the patch earlier than 72 hours if you experience unpleasant side effects which may include dry mouth, dizziness or visual disturbances. 3. Avoid touching the patch. Wash your hands with soap and water after contact with the patch.     

## 2021-01-06 NOTE — Op Note (Signed)
01/06/2021  3:16 PM  PATIENT:  Julia Mcknight  25 y.o. female  PRE-OPERATIVE DIAGNOSIS:  Adnexal Mass (N94.9)  POST-OPERATIVE DIAGNOSIS:  Adnexal Mass, ovarian cyst  PROCEDURE:  Procedure(s): DIAGNOSTIC LAPAROSCOPY, BILATERAL PORTION OF OVARIAN CYSTECTOMY (Bilateral)  SURGEON:  Surgeon(s) and Role:    Gerald Leitz, MD - Primary    * Steva Ready, DO - Assisting  PHYSICIAN ASSISTANT:   ASSISTANTS: Dr. Connye Burkitt assisted due to complexity of the anatomy    ANESTHESIA:   general  EBL:  25 mL   BLOOD ADMINISTERED:none  DRAINS: Urinary Catheter (Foley)   LOCAL MEDICATIONS USED:  MARCAINE     SPECIMEN:  Source of Specimen:  portion of the left and right ovarian cyst   DISPOSITION OF SPECIMEN:  PATHOLOGY  COUNTS:  YES  TOURNIQUET:  * No tourniquets in log *  DICTATION: .Note written in EPIC  PLAN OF CARE: Discharge to home after PACU  PATIENT DISPOSITION:  PACU - hemodynamically stable.   Delay start of Pharmacological VTE agent (>24hrs) due to surgical blood loss or risk of bleeding: not applicable  Findings: Large 13 cm left hemorrhagic ovarian cyst. .. 2 cm right hemorrhagic ovarian cyst. Normal appearing uterus and fallopian tubes   Procedure:The patient was taken to the operating room placed under general anesthesia. Time Out was performed.  She was  Prepped and draped in the normal sterile fashion. A foley catheter was placed. A sponge stick was placed into the vaginal vault.  Attention was turned to the abdomen where the umbilicus was injected with 10 cc of marcaine. A 10 mm trocar was placed under direct visualization. Pneumoperitoneum was achieved with C02 gas... A 5 mm trocar was placed in the right and left lower quadrants. Each trocar site was injected with 10 cc of marcaine prior to trocar placement. The harmonic scalpel was used transect the Left  ovarian cyst. The harmonic scalpel was used to transect and excise the right ovarian cyst.  An endo catch bag was  placed through the 10 mm umbilical port. The specimen was placed in the bag and removed through the umbilical incision. Pneumoperitoneum was reestablished.  The pelvis was irrigated. Excellent hemostasis was noted.  Arista was placed along the left and right ovary. All trocars were removed under direct visualization . The pneumoperitoneum was released.  The fascia of the umbilical incision was re approximated with 0 vicryl. The skin incisions were closed with 4-0 vicryl and derma bond.  the patient was taken to the recovery room awake and in stable condition.  Sponge lap and needle counts were correct times 2.

## 2021-01-06 NOTE — Anesthesia Postprocedure Evaluation (Signed)
Anesthesia Post Note  Patient: Julia Mcknight  Procedure(s) Performed: DIAGNOSTIC LAPAROSCOPY, BILATERAL PORTION OF OVARIAN CYSTECTOMY (Bilateral: Abdomen)     Patient location during evaluation: PACU Anesthesia Type: General Level of consciousness: awake and alert and oriented Pain management: pain level controlled Vital Signs Assessment: post-procedure vital signs reviewed and stable Respiratory status: spontaneous breathing, nonlabored ventilation and respiratory function stable Cardiovascular status: blood pressure returned to baseline and stable Postop Assessment: no apparent nausea or vomiting Anesthetic complications: no   No notable events documented.  Last Vitals:  Vitals:   01/06/21 1615 01/06/21 1638  BP: 126/83 102/74  Pulse: 72 93  Resp: 18 17  Temp:  36.4 C  SpO2: 100% 97%    Last Pain:  Vitals:   01/06/21 1638  TempSrc:   PainSc: 0-No pain                 Gatlin Kittell A.

## 2021-01-06 NOTE — Anesthesia Preprocedure Evaluation (Signed)
Anesthesia Evaluation  Patient identified by MRN, date of birth, ID band Patient awake    Reviewed: Allergy & Precautions, NPO status , Patient's Chart, lab work & pertinent test results  Airway Mallampati: III  TM Distance: >3 FB Neck ROM: Full    Dental no notable dental hx. (+) Teeth Intact, Dental Advisory Given   Pulmonary neg pulmonary ROS, former smoker,    Pulmonary exam normal breath sounds clear to auscultation       Cardiovascular negative cardio ROS Normal cardiovascular exam Rhythm:Regular Rate:Normal     Neuro/Psych negative neurological ROS  negative psych ROS   GI/Hepatic negative GI ROS, Neg liver ROS,   Endo/Other  Obesity  Renal/GU negative Renal ROS  negative genitourinary   Musculoskeletal negative musculoskeletal ROS (+)   Abdominal (+) + obese,   Peds  Hematology negative hematology ROS (+)   Anesthesia Other Findings   Reproductive/Obstetrics Left ovarian Cyst                             Anesthesia Physical Anesthesia Plan  ASA: 2  Anesthesia Plan: General   Post-op Pain Management:    Induction: Intravenous  PONV Risk Score and Plan: 4 or greater and Scopolamine patch - Pre-op, Midazolam, Treatment may vary due to age or medical condition, Dexamethasone and Ondansetron  Airway Management Planned: Oral ETT  Additional Equipment: None  Intra-op Plan:   Post-operative Plan: Extubation in OR  Informed Consent: I have reviewed the patients History and Physical, chart, labs and discussed the procedure including the risks, benefits and alternatives for the proposed anesthesia with the patient or authorized representative who has indicated his/her understanding and acceptance.     Dental advisory given  Plan Discussed with: CRNA and Anesthesiologist  Anesthesia Plan Comments:         Anesthesia Quick Evaluation

## 2021-01-06 NOTE — Anesthesia Procedure Notes (Signed)
Procedure Name: Intubation Date/Time: 01/06/2021 1:30 PM Performed by: Signe Colt, CRNA Pre-anesthesia Checklist: Patient identified, Emergency Drugs available, Suction available and Patient being monitored Patient Re-evaluated:Patient Re-evaluated prior to induction Oxygen Delivery Method: Circle system utilized Preoxygenation: Pre-oxygenation with 100% oxygen Induction Type: IV induction Ventilation: Mask ventilation without difficulty Laryngoscope Size: Mac and 3 Grade View: Grade I Tube type: Oral Tube size: 7.0 mm Number of attempts: 1 Airway Equipment and Method: Stylet and Oral airway Placement Confirmation: ETT inserted through vocal cords under direct vision, positive ETCO2 and breath sounds checked- equal and bilateral Secured at: 22 cm Tube secured with: Tape Dental Injury: Teeth and Oropharynx as per pre-operative assessment

## 2021-01-07 ENCOUNTER — Encounter (HOSPITAL_BASED_OUTPATIENT_CLINIC_OR_DEPARTMENT_OTHER): Payer: Self-pay | Admitting: Obstetrics and Gynecology

## 2021-01-07 LAB — CYTOLOGY - NON PAP

## 2021-01-08 LAB — SURGICAL PATHOLOGY

## 2021-11-08 ENCOUNTER — Inpatient Hospital Stay (HOSPITAL_COMMUNITY)
Admission: AD | Admit: 2021-11-08 | Discharge: 2021-11-08 | Disposition: A | Discharge status: Home / Self Care | Payer: 59 | Attending: Obstetrics and Gynecology | Admitting: Obstetrics and Gynecology

## 2021-11-08 ENCOUNTER — Inpatient Hospital Stay (HOSPITAL_COMMUNITY): Payer: 59

## 2021-11-08 ENCOUNTER — Encounter (HOSPITAL_COMMUNITY): Payer: Self-pay | Admitting: *Deleted

## 2021-11-08 DIAGNOSIS — O99891 Other specified diseases and conditions complicating pregnancy: Secondary | ICD-10-CM | POA: Insufficient documentation

## 2021-11-08 DIAGNOSIS — Z3490 Encounter for supervision of normal pregnancy, unspecified, unspecified trimester: Secondary | ICD-10-CM

## 2021-11-08 DIAGNOSIS — R1030 Lower abdominal pain, unspecified: Secondary | ICD-10-CM | POA: Diagnosis not present

## 2021-11-08 DIAGNOSIS — Z3A01 Less than 8 weeks gestation of pregnancy: Secondary | ICD-10-CM | POA: Diagnosis not present

## 2021-11-08 DIAGNOSIS — O26851 Spotting complicating pregnancy, first trimester: Secondary | ICD-10-CM | POA: Insufficient documentation

## 2021-11-08 LAB — URINALYSIS, ROUTINE W REFLEX MICROSCOPIC
Bilirubin Urine: NEGATIVE
Glucose, UA: NEGATIVE mg/dL
Hgb urine dipstick: NEGATIVE
Ketones, ur: NEGATIVE mg/dL
Leukocytes,Ua: NEGATIVE
Nitrite: NEGATIVE
Protein, ur: NEGATIVE mg/dL
Specific Gravity, Urine: 1.018 (ref 1.005–1.030)
pH: 5 (ref 5.0–8.0)

## 2021-11-08 LAB — CBC
HCT: 36.6 % (ref 36.0–46.0)
Hemoglobin: 13 g/dL (ref 12.0–15.0)
MCH: 31.4 pg (ref 26.0–34.0)
MCHC: 35.5 g/dL (ref 30.0–36.0)
MCV: 88.4 fL (ref 80.0–100.0)
Platelets: 257 10*3/uL (ref 150–400)
RBC: 4.14 MIL/uL (ref 3.87–5.11)
RDW: 12.3 % (ref 11.5–15.5)
WBC: 9.8 10*3/uL (ref 4.0–10.5)
nRBC: 0 % (ref 0.0–0.2)

## 2021-11-08 LAB — COMPREHENSIVE METABOLIC PANEL
ALT: 15 U/L (ref 0–44)
AST: 13 U/L — ABNORMAL LOW (ref 15–41)
Albumin: 3.6 g/dL (ref 3.5–5.0)
Alkaline Phosphatase: 77 U/L (ref 38–126)
Anion gap: 8 (ref 5–15)
BUN: 10 mg/dL (ref 6–20)
CO2: 24 mmol/L (ref 22–32)
Calcium: 8.9 mg/dL (ref 8.9–10.3)
Chloride: 106 mmol/L (ref 98–111)
Creatinine, Ser: 0.65 mg/dL (ref 0.44–1.00)
GFR, Estimated: >60 mL/min (ref >60)
Glucose, Bld: 100 mg/dL — ABNORMAL HIGH (ref 70–99)
Potassium: 4 mmol/L (ref 3.5–5.1)
Sodium: 138 mmol/L (ref 135–145)
Total Bilirubin: <0.1 mg/dL — ABNORMAL LOW (ref 0.3–1.2)
Total Protein: 6.9 g/dL (ref 6.5–8.1)

## 2021-11-08 LAB — HCG, QUANTITATIVE, PREGNANCY: hCG, Beta Chain, Quant, S: 23654 m[IU]/mL — ABNORMAL HIGH (ref <5)

## 2021-11-08 LAB — POCT PREGNANCY, URINE: Preg Test, Ur: POSITIVE — AB

## 2021-11-08 MED ORDER — PREPLUS 27-1 MG PO TABS: 1.0000 | ORAL_TABLET | Freq: Every day | ORAL | 13 refills | Status: DC

## 2021-11-08 NOTE — Discharge Instructions (Signed)

## 2021-11-08 NOTE — MAU Provider Note (Signed)
History     CSN: [id number]  Arrival date and time: 11/08/21 1542   Event Date/Time   First Provider Initiated Contact with Patient 11/08/21 1803      Chief Complaint  Patient presents with   Vaginal Bleeding   Abdominal Pain   Possible Pregnancy   HPI Julia Mcknight is a 26 y.o. G1P0000 in early pregnancy who presents to MAU with chief complaints of "sharp" abdominal pain and vaginal spotting. These are recurrent problems, onset six days ago. Most recent episode of pain was around noon today. She denies overt vaginal bleeding. She is not experiencing dizziness, weakness or syncope. She is planning prenatal care with [facility]  OB History     Gravida  1   Para  0   Term  0   Preterm  0   AB  0   Living  0      SAB  0   IAB  0   Ectopic  0   Multiple  0   Live Births  0           Past Medical History:  Diagnosis Date   Ovarian cyst     Past Surgical History:  Procedure Laterality Date   LAPAROSCOPY Bilateral 01/06/2021   Procedure: DIAGNOSTIC LAPAROSCOPY, BILATERAL PORTION OF OVARIAN CYSTECTOMY;  Surgeon: Christophe Louis, MD;  Location: [place];  Service: Gynecology;  Laterality: Bilateral;   NO PAST SURGERIES      Family History  Problem Relation Age of Onset   Hyperlipidemia Mother    Hypertension Mother    Diabetes Maternal Grandmother    Hyperlipidemia Maternal Grandmother    Hypertension Maternal Grandmother    Mental retardation Maternal Grandmother    Hypertension Maternal Grandfather     Social History   Tobacco Use   Smoking status: Former    Packs/day: 0.50    Types: Cigars, Cigarettes    Quit date: 2022    Years since quitting: 1.4   Smokeless tobacco: Never  Vaping Use   Vaping Use: Never used  Substance Use Topics   Alcohol use: Yes    Comment: occasionally   Drug use: No    Allergies:  Allergies  Allergen Reactions   Nickel     Medications Prior to Admission  Medication Sig Dispense Refill Last  Dose   ibuprofen (ADVIL) 800 MG tablet Take 1 tablet (800 mg total) by mouth every 8 (eight) hours as needed. 30 tablet 0    oxyCODONE (OXY IR/ROXICODONE) 5 MG immediate release tablet Take 1 tablet (5 mg total) by mouth every 6 (six) hours as needed for severe pain. 15 tablet 0     Review of Systems  Gastrointestinal:  Positive for abdominal pain.  Genitourinary:  Positive for vaginal bleeding.  All other systems reviewed and are negative.  Physical Exam   Blood pressure 133/81, pulse 98, temperature 98.4 F (36.9 C), temperature source Oral, resp. rate 18, height 5\' 5"  (1.651 m), weight 104.6 kg, last menstrual period 09/29/2021, SpO2 97 %.  Physical Exam Vitals and nursing note reviewed. Exam conducted with a chaperone present.  Constitutional:      Appearance: She is well-developed. She is not ill-appearing.  Cardiovascular:     Rate and Rhythm: Normal rate and regular rhythm.     Heart sounds: Normal heart sounds.  Pulmonary:     Effort: Pulmonary effort is normal.     Breath sounds: Normal breath sounds.  Abdominal:     Palpations:  Abdomen is soft.     Tenderness: There is no abdominal tenderness.  Skin:    Capillary Refill: Capillary refill takes less than 2 seconds.  Neurological:     Mental Status: She is alert and oriented to person, place, and time.  Psychiatric:        Mood and Affect: Mood normal.        Behavior: Behavior normal.     MAU Course  Procedures  MDM Patient Vitals for the past 24 hrs:  BP Temp Temp src Pulse Resp SpO2 Height Weight  11/08/21 1620 133/81 98.4 F (36.9 C) Oral 98 18 97 % 5\' 5"  (1.651 m) 104.6 kg    Orders Placed This Encounter  Procedures   OB LESS THAN 14 WEEKS WITH OB TRANSVAGINAL   CBC   Comprehensive metabolic panel   hCG, quantitative, pregnancy   Urinalysis, Routine w reflex microscopic Urine, Clean Catch   Diet NPO time specified   Pregnancy, urine POC   Discharge patient   Results for orders placed or  performed during the hospital encounter of 11/08/21 (from the past 24 hour(s))  Pregnancy, urine POC     Status: Abnormal   Collection Time: 11/08/21  4:31 PM  Result Value Ref Range   Preg Test, Ur POSITIVE (A) NEGATIVE  CBC     Status: None   Collection Time: 11/08/21  4:56 PM  Result Value Ref Range   WBC 9.8 4.0 - 10.5 K/uL   RBC 4.14 3.87 - 5.11 MIL/uL   Hemoglobin 13.0 12.0 - 15.0 g/dL   HCT 11/10/21 31.4 - 97.0 %   MCV 88.4 80.0 - 100.0 fL   MCH 31.4 26.0 - 34.0 pg   MCHC 35.5 30.0 - 36.0 g/dL   RDW 26.3 78.5 - 88.5 %   Platelets 257 150 - 400 K/uL   nRBC 0.0 0.0 - 0.2 %  Comprehensive metabolic panel     Status: Abnormal   Collection Time: 11/08/21  4:56 PM  Result Value Ref Range   Sodium 138 135 - 145 mmol/L   Potassium 4.0 3.5 - 5.1 mmol/L   Chloride 106 98 - 111 mmol/L   CO2 24 22 - 32 mmol/L   Glucose, Bld 100 (H) 70 - 99 mg/dL   BUN 10 6 - 20 mg/dL   Creatinine, Ser 11/10/21 0.44 - 1.00 mg/dL   Calcium 8.9 8.9 - 7.41 mg/dL   Total Protein 6.9 6.5 - 8.1 g/dL   Albumin 3.6 3.5 - 5.0 g/dL   AST 13 (L) 15 - 41 U/L   ALT 15 0 - 44 U/L   Alkaline Phosphatase 77 38 - 126 U/L   Total Bilirubin <0.1 (L) 0.3 - 1.2 mg/dL   GFR, Estimated 28.7 >86 mL/min   Anion gap 8 5 - 15  hCG, quantitative, pregnancy     Status: Abnormal   Collection Time: 11/08/21  4:56 PM  Result Value Ref Range   hCG, Beta Chain, Quant, S 23,654 (H) <5 mIU/mL  Urinalysis, Routine w reflex microscopic Urine, Clean Catch     Status: None   Collection Time: 11/08/21  5:07 PM  Result Value Ref Range   Color, Urine YELLOW YELLOW   APPearance CLEAR CLEAR   Specific Gravity, Urine 1.018 1.005 - 1.030   pH 5.0 5.0 - 8.0   Glucose, UA NEGATIVE NEGATIVE mg/dL   Hgb urine dipstick NEGATIVE NEGATIVE   Bilirubin Urine NEGATIVE NEGATIVE   Ketones, ur NEGATIVE NEGATIVE mg/dL  Protein, ur NEGATIVE NEGATIVE mg/dL   Nitrite NEGATIVE NEGATIVE   Leukocytes,Ua NEGATIVE NEGATIVE   US OB LESS THAN 14 WEEKS WITH OB  TRANSVAGINAL  Result Date: 11/08/2021 CLINICAL DATA:  Spotting and cramping. EXAM: OBSTETRIC <14 WK [place] AND TRANSVAGINAL OB US TECHNIQUE: Both transabdominal and transvaginal ultrasound examinations were performed for complete evaluation of the gestation as well as the maternal uterus, adnexal regions, and pelvic cul-de-sac. Transvaginal technique was performed to assess early pregnancy. COMPARISON:  None Available. FINDINGS: Intrauterine gestational sac: Single Yolk sac:  Visualized. Embryo:  Not Visualized. Cardiac Activity: Not Visualized. MSD: 13.2 mm   6 w   1 d Subchorionic hemorrhage:  None visualized. Maternal uterus/adnexae: Right ovary: Normal Left ovary: Normal Other :None Free fluid:  None IMPRESSION: 1. Probable early intrauterine gestational sac with yolk sac, but no fetal pole, or cardiac activity yet visualized. Recommend follow-up quantitative B-HCG levels and follow-up US in 14 days to confirm and assess viability. This recommendation follows SRU consensus guidelines: Diagnostic Criteria for Nonviable Pregnancy Early in the First Trimester. Malva Limes Med 2013; 671:2458-09. Electronically Signed   By: Signa Kell M.D.   On: 11/08/2021 17:56    Meds ordered this encounter  Medications   Prenatal Vit-Fe Fumarate-FA (PREPLUS) 27-1 MG TABS    Sig: Take 1 tablet by mouth daily.    Dispense:  30 tablet    Refill:  13    Order Specific Question:   Supervising Provider    Answer:   Levie Heritage [[id number]]   Assessment and Plan  --26 y.o. G1P0000 with confirmed IUP (+GS, + YS) --Rx Prenatal vitamin per patient request --Discharge home in stable condition with first trimester precautions  F/U: Patient has New OB scheduled with Collene Schlichter, MSA, MSN, CNM 11/08/2021, 7:03 PM

## 2021-11-08 NOTE — MAU Note (Signed)
Julia Mcknight is a 26 y.o. at Unknown here in MAU reporting: positive HPT last Tues.  Since Sat has been excruciating sharp pains in the bottom of her stomach. Started spotting last night (reddish). LMP: 5/10 Onset of complaint: last Tues Pain score: 5 Vitals:   11/08/21 1620  BP: 133/81  Pulse: 98  Resp: 18  Temp: 98.4 F (36.9 C)  SpO2: 97%      Lab orders placed from triage:  UPT, UA if +

## 2023-10-03 ENCOUNTER — Encounter (HOSPITAL_BASED_OUTPATIENT_CLINIC_OR_DEPARTMENT_OTHER): Payer: Self-pay | Admitting: Orthopaedic Surgery

## 2023-10-03 ENCOUNTER — Other Ambulatory Visit: Payer: Self-pay

## 2023-10-04 NOTE — H&P (Signed)
 PREOPERATIVE H&P  Chief Complaint: supracondylar fracture of left humerus  HPI: Julia Mcknight is a 28 y.o. female who is scheduled for, Procedure(s): OPEN REDUCTION INTERNAL FIXATION (ORIF) DISTAL HUMERUS FRACTURE.   Patient had an injury to the left arm on 5/9. She had immediate pain and presented to the ER. She was placed in a splint and told to follow up with orthopedics.   Symptoms are rated as moderate to severe, and have been worsening.  This is significantly impairing activities of daily living.    Please see clinic note for further details on this patient's care.    She has elected for surgical management.   Past Medical History:  Diagnosis Date   Ovarian cyst    Supracondylar fracture of humerus    Past Surgical History:  Procedure Laterality Date   LAPAROSCOPY Bilateral 01/06/2021   Procedure: DIAGNOSTIC LAPAROSCOPY, BILATERAL PORTION OF OVARIAN CYSTECTOMY;  Surgeon: Arlee Lace, MD;  Location: [facility];  Service: Gynecology;  Laterality: Bilateral;   Social History   Socioeconomic History   Marital status: Single    Spouse name: Not on file   Number of children: Not on file   Years of education: Not on file   Highest education level: Not on file  Occupational History   Not on file  Tobacco Use   Smoking status: Former    Current packs/day: 0.50    Types: Cigars, Cigarettes    Quit date: 2022   Smokeless tobacco: Never  Vaping Use   Vaping status: Never Used  Substance and Sexual Activity   Alcohol use: Yes    Comment: occasionally   Drug use: No   Sexual activity: Yes    Partners: Male    Birth control/protection: None  Other Topics Concern   Not on file  Social History Narrative   Not on file   Social Drivers of Health   Financial Resource Strain: Not on file  Food Insecurity: Not on file  Transportation Needs: Not on file  Physical Activity: Not on file  Stress: Not on file  Social Connections: Not on file   Family  History  Problem Relation Age of Onset   Hyperlipidemia Mother    Hypertension Mother    Diabetes Maternal Grandmother    Hyperlipidemia Maternal Grandmother    Hypertension Maternal Grandmother    Mental retardation Maternal Grandmother    Hypertension Maternal Grandfather    Allergies  Allergen Reactions   Nickel    Prior to Admission medications   Medication Sig Start Date End Date Taking? Authorizing Provider  oxyCODONE -acetaminophen  (PERCOCET/ROXICET) 5-325 MG tablet Take 1 tablet by mouth every 6 (six) hours as needed for severe pain (pain score 7-10).   Yes [provider]    ROS: All other systems have been reviewed and were otherwise negative with the exception of those mentioned in the HPI and as above.  Physical Exam: General: Alert, no acute distress Cardiovascular: No pedal edema Respiratory: No cyanosis, no use of accessory musculature GI: No organomegaly, abdomen is soft and non-tender Skin: No lesions in the area of chief complaint Neurologic: Sensation intact distally Psychiatric: Patient is competent for consent with normal mood and affect Lymphatic: No axillary or cervical lymphadenopathy  MUSCULOSKELETAL:  LUE: Splint CDI. Skin intact though cannot assess fully beneath splint. Nontender to palpation proximally. Well perfused digits.    Imaging: Xrays demonstrate a distal third humeral shaft fracture  Assessment: supracondylar fracture of left humerus  Plan: Plan for  Procedure(s): OPEN REDUCTION INTERNAL FIXATION (ORIF) DISTAL HUMERUS FRACTURE  The risks benefits and alternatives were discussed with the patient including but not limited to the risks of nonoperative treatment, versus surgical intervention including infection, bleeding, nerve injury,  blood clots, cardiopulmonary complications, morbidity, mortality, among others, and they were willing to proceed.   The patient acknowledged the explanation, agreed to proceed with the plan and  consent was signed.   Operative Plan: ORIF left humerus fracture Discharge Medications: standard DVT Prophylaxis: none Physical Therapy: outpatient PT Special Discharge needs: Sling for comfort.    Adine Ahmadi, PA-C  10/04/2023 2:29 PM

## 2023-10-04 NOTE — Discharge Instructions (Signed)
 Grafton Lawrence MD, MPH Nicholas Bari, PA-C [facility] [address] [phone] (tel)   [phone] (fax)   POST-OPERATIVE INSTRUCTIONS  WOUND CARE You may remove the Operative Dressing on Post-Op Day #3 (72hrs after surgery).   Alternatively if you would like you can leave dressing on until follow-up if within 7-8 days but keep it dry. Leave steri-strips in place until they fall off on their own, usually 2 weeks postop. There may be a small amount of fluid/bleeding leaking at the surgical site.  This is normal You may change/reinforce the bandage as needed.  Use the Cryocuff or Ice as often as possible for the first 7 days, then as needed for pain relief. Always keep a towel, ACE wrap or other barrier between the cooling unit and your skin.  You may shower on Post-Op Day #3. Gently pat the area dry.  Do not soak the shoulder in water or submerge it.  Keep incisions as dry as possible. Do not go swimming in the pool or ocean until 4 weeks after surgery or when otherwise instructed.    EXERCISES Wear the sling for comfort You may remove the sling for showering, but keep the arm across the chest or in a secondary sling.     It is normal for your fingers/hand to become more swollen after surgery and discolored from bruising.   This will resolve over the first few weeks usually after surgery. Please continue to ambulate and do not stay sitting or lying for too long.  Perform foot and wrist pumps to assist in circulation.  PHYSICAL THERAPY - You will begin physical therapy soon after surgery (unless otherwise specified) - Please call to set up an appointment, if you do not already have one  - Let our office if there are any issues with scheduling your therapy  - Our office will call you to schedule post-op PT  REGIONAL ANESTHESIA (NERVE BLOCKS) The anesthesia team may have performed a nerve block for you this is a great tool used to minimize pain.    The block may start wearing off overnight (between 8-24 hours postop) When the block wears off, your pain may go from nearly zero to the pain you would have had postop without the block. This is an abrupt transition but nothing dangerous is happening.   This can be a challenging period but utilize your as needed pain medications to try and manage this period. We suggest you use the pain medication the first night prior to going to bed, to ease this transition.  You may take an extra dose of narcotic when this happens if needed  POST-OP MEDICATIONS- Multimodal approach to pain control In general your pain will be controlled with a combination of substances.  Prescriptions unless otherwise discussed are electronically sent to your pharmacy.  This is a carefully made plan we use to minimize narcotic use.     Ibuprofen  - Anti-inflammatory medication taken on a scheduled basis Acetaminophen  - Non-narcotic pain medicine taken on a scheduled basis  Gabapentin - this is to help with nerve based pain, take on a scheduled basis Oxycodone  - This is a strong narcotic, to be used only on an "as needed" basis for SEVERE pain. Zofran  - take as needed for nausea   FOLLOW-UP If you develop a Fever (>=101.5), Redness or Drainage from the surgical incision site, please call our office to arrange for an evaluation. Please call the office to schedule a follow-up appointment for  your first post-operative appointment, 7-10 days post-operatively.    HELPFUL INFORMATION   You may be more comfortable sleeping in a semi-seated position the first few nights following surgery.  Keep a pillow propped under the elbow and forearm for comfort.  If you have a recliner type of chair it might be beneficial.  If not that is fine too, but it would be helpful to sleep propped up with pillows behind your operated shoulder as well under your elbow and forearm.  This will reduce pulling on the suture lines.  When dressing, put  your operative arm in the sleeve first.  When getting undressed, take your operative arm out last.  Loose fitting, button-down shirts are recommended.  Often in the first days after surgery you may be more comfortable keeping your operative arm under your shirt and not through the sleeve.  You may return to work/school in the next couple of days when you feel up to it.  Desk work and typing in the sling is fine.  We suggest you use the pain medication the first night prior to going to bed, in order to ease any pain when the anesthesia wears off. You should avoid taking pain medications on an empty stomach as it will make you nauseous.  You should wean off your narcotic medicines as soon as you are able.  Most patients will be off narcotics before their first postop appointment.   Do not drink alcoholic beverages or take illicit drugs when taking pain medications.  It is against the law to drive while taking narcotics.  In some states it is against the law to drive while your arm is in a sling.   Pain medication may make you constipated.  Below are a few solutions to try in this order: Decrease the amount of pain medication if you aren't having pain. Drink lots of decaffeinated fluids. Drink prune juice and/or eat dried prunes  If the first 3 don't work start with additional solutions Take Colace - an over-the-counter stool softener Take Senokot - an over-the-counter laxative Take Miralax - a stronger over-the-counter laxative  For more information including helpful videos and documents visit our website:   [email or web address]

## 2023-10-05 ENCOUNTER — Ambulatory Visit (HOSPITAL_BASED_OUTPATIENT_CLINIC_OR_DEPARTMENT_OTHER)
Admission: RE | Admit: 2023-10-05 | Discharge: 2023-10-05 | Disposition: A | Discharge status: Home / Self Care | Payer: PRIVATE HEALTH INSURANCE | Attending: Orthopaedic Surgery | Admitting: Orthopaedic Surgery

## 2023-10-05 ENCOUNTER — Other Ambulatory Visit: Payer: Self-pay

## 2023-10-05 ENCOUNTER — Ambulatory Visit (HOSPITAL_COMMUNITY): Payer: PRIVATE HEALTH INSURANCE

## 2023-10-05 ENCOUNTER — Encounter (HOSPITAL_BASED_OUTPATIENT_CLINIC_OR_DEPARTMENT_OTHER)
Admission: RE | Disposition: A | Discharge status: Home / Self Care | Payer: Self-pay | Source: Home / Self Care | Attending: Orthopaedic Surgery

## 2023-10-05 ENCOUNTER — Ambulatory Visit (HOSPITAL_BASED_OUTPATIENT_CLINIC_OR_DEPARTMENT_OTHER): Payer: PRIVATE HEALTH INSURANCE | Admitting: Anesthesiology

## 2023-10-05 ENCOUNTER — Encounter (HOSPITAL_BASED_OUTPATIENT_CLINIC_OR_DEPARTMENT_OTHER): Payer: Self-pay | Admitting: Orthopaedic Surgery

## 2023-10-05 DIAGNOSIS — F17290 Nicotine dependence, other tobacco product, uncomplicated: Secondary | ICD-10-CM | POA: Insufficient documentation

## 2023-10-05 DIAGNOSIS — F17210 Nicotine dependence, cigarettes, uncomplicated: Secondary | ICD-10-CM | POA: Insufficient documentation

## 2023-10-05 DIAGNOSIS — S42412A Displaced simple supracondylar fracture without intercondylar fracture of left humerus, initial encounter for closed fracture: Secondary | ICD-10-CM

## 2023-10-05 DIAGNOSIS — X58XXXA Exposure to other specified factors, initial encounter: Secondary | ICD-10-CM | POA: Insufficient documentation

## 2023-10-05 DIAGNOSIS — Z01818 Encounter for other preprocedural examination: Secondary | ICD-10-CM

## 2023-10-05 HISTORY — PX: ORIF HUMERUS FRACTURE: SHX2126

## 2023-10-05 HISTORY — DX: Displaced simple supracondylar fracture without intercondylar fracture of unspecified humerus, initial encounter for closed fracture: S42.413A

## 2023-10-05 LAB — POCT PREGNANCY, URINE: Preg Test, Ur: NEGATIVE

## 2023-10-05 SURGERY — OPEN REDUCTION INTERNAL FIXATION (ORIF) DISTAL HUMERUS FRACTURE
Anesthesia: General | Site: Arm Upper | Laterality: Left

## 2023-10-05 MED ORDER — MIDAZOLAM HCL 2 MG/2ML IJ SOLN: 0.5000 mg | Freq: Once | INTRAMUSCULAR | Status: DC | PRN

## 2023-10-05 MED ORDER — 0.9 % SODIUM CHLORIDE (POUR BTL) OPTIME
TOPICAL | Status: DC | PRN
Start: 2023-10-05 — End: 2023-10-05
  Administered 2023-10-05: 1000 mL

## 2023-10-05 MED ORDER — SUGAMMADEX SODIUM 200 MG/2ML IV SOLN
INTRAVENOUS | Status: DC | PRN
  Administered 2023-10-05: 200 mg via INTRAVENOUS

## 2023-10-05 MED ORDER — MIDAZOLAM HCL 2 MG/2ML IJ SOLN
2.0000 mg | Freq: Once | INTRAMUSCULAR | Status: AC
  Administered 2023-10-05: 2 mg via INTRAVENOUS

## 2023-10-05 MED ORDER — FENTANYL CITRATE (PF) 100 MCG/2ML IJ SOLN
INTRAMUSCULAR | Status: AC
  Filled 2023-10-05: qty 2

## 2023-10-05 MED ORDER — ACETAMINOPHEN 500 MG PO TABS
1000.0000 mg | ORAL_TABLET | Freq: Once | ORAL | Status: AC
  Administered 2023-10-05: 1000 mg via ORAL

## 2023-10-05 MED ORDER — ACETAMINOPHEN 500 MG PO TABS: 1000.0000 mg | ORAL_TABLET | Freq: Three times a day (TID) | ORAL | 0 refills | Status: AC

## 2023-10-05 MED ORDER — MEPERIDINE HCL 25 MG/ML IJ SOLN: 6.2500 mg | INTRAMUSCULAR | Status: DC | PRN

## 2023-10-05 MED ORDER — GABAPENTIN 100 MG PO CAPS: 100.0000 mg | ORAL_CAPSULE | Freq: Three times a day (TID) | ORAL | 0 refills | Status: AC

## 2023-10-05 MED ORDER — ACETAMINOPHEN 500 MG PO TABS
ORAL_TABLET | ORAL | Status: AC
  Filled 2023-10-05: qty 2

## 2023-10-05 MED ORDER — ONDANSETRON HCL 4 MG/2ML IJ SOLN
INTRAMUSCULAR | Status: AC
  Filled 2023-10-05: qty 2

## 2023-10-05 MED ORDER — HYDROMORPHONE HCL 1 MG/ML IJ SOLN
0.2500 mg | INTRAMUSCULAR | Status: DC | PRN
Start: 2023-10-05 — End: 2023-10-05
  Administered 2023-10-05 (×2): 0.25 mg via INTRAVENOUS

## 2023-10-05 MED ORDER — LACTATED RINGERS IV SOLN: INTRAVENOUS | Status: DC

## 2023-10-05 MED ORDER — OXYCODONE HCL 5 MG PO TABS
5.0000 mg | ORAL_TABLET | Freq: Once | ORAL | Status: AC | PRN
  Administered 2023-10-05: 5 mg via ORAL

## 2023-10-05 MED ORDER — ROCURONIUM 10MG/ML (10ML) SYRINGE FOR MEDFUSION PUMP - OPTIME
INTRAVENOUS | Status: DC | PRN
  Administered 2023-10-05: 60 mg via INTRAVENOUS

## 2023-10-05 MED ORDER — SCOPOLAMINE 1 MG/3DAYS TD PT72
1.0000 | MEDICATED_PATCH | Freq: Once | TRANSDERMAL | Status: DC
  Administered 2023-10-05: 1.5 mg via TRANSDERMAL

## 2023-10-05 MED ORDER — ROCURONIUM BROMIDE 10 MG/ML (PF) SYRINGE
PREFILLED_SYRINGE | INTRAVENOUS | Status: AC
  Filled 2023-10-05: qty 10

## 2023-10-05 MED ORDER — IBUPROFEN 800 MG PO TABS: 800.0000 mg | ORAL_TABLET | Freq: Three times a day (TID) | ORAL | 0 refills | Status: AC | PRN

## 2023-10-05 MED ORDER — GABAPENTIN 300 MG PO CAPS
ORAL_CAPSULE | ORAL | Status: AC
  Filled 2023-10-05: qty 1

## 2023-10-05 MED ORDER — VANCOMYCIN HCL 1 G IV SOLR
INTRAVENOUS | Status: DC | PRN
  Administered 2023-10-05: 1000 mg via TOPICAL

## 2023-10-05 MED ORDER — PROPOFOL 10 MG/ML IV BOLUS
INTRAVENOUS | Status: DC | PRN
  Administered 2023-10-05: 200 mg via INTRAVENOUS

## 2023-10-05 MED ORDER — PHENYLEPHRINE 80 MCG/ML (10ML) SYRINGE FOR IV PUSH (FOR BLOOD PRESSURE SUPPORT)
PREFILLED_SYRINGE | INTRAVENOUS | Status: DC | PRN
  Administered 2023-10-05 (×2): 80 ug via INTRAVENOUS

## 2023-10-05 MED ORDER — FENTANYL CITRATE (PF) 100 MCG/2ML IJ SOLN
100.0000 ug | Freq: Once | INTRAMUSCULAR | Status: AC
  Administered 2023-10-05: 100 ug via INTRAVENOUS

## 2023-10-05 MED ORDER — FENTANYL CITRATE (PF) 100 MCG/2ML IJ SOLN
INTRAMUSCULAR | Status: DC | PRN
Start: 2023-10-05 — End: 2023-10-05
  Administered 2023-10-05: 50 ug via INTRAVENOUS
  Administered 2023-10-05: 25 ug via INTRAVENOUS

## 2023-10-05 MED ORDER — ONDANSETRON HCL 4 MG/2ML IJ SOLN
INTRAMUSCULAR | Status: DC | PRN
Start: 2023-10-05 — End: 2023-10-05
  Administered 2023-10-05: 4 mg via INTRAVENOUS

## 2023-10-05 MED ORDER — BUPIVACAINE-EPINEPHRINE (PF) 0.5% -1:200000 IJ SOLN
INTRAMUSCULAR | Status: DC | PRN
Start: 2023-10-05 — End: 2023-10-05
  Administered 2023-10-05: 10 mL via PERINEURAL

## 2023-10-05 MED ORDER — SCOPOLAMINE 1 MG/3DAYS TD PT72
MEDICATED_PATCH | TRANSDERMAL | Status: AC
  Filled 2023-10-05: qty 1

## 2023-10-05 MED ORDER — DEXAMETHASONE SODIUM PHOSPHATE 10 MG/ML IJ SOLN
INTRAMUSCULAR | Status: AC
  Filled 2023-10-05: qty 1

## 2023-10-05 MED ORDER — GABAPENTIN 300 MG PO CAPS
300.0000 mg | ORAL_CAPSULE | Freq: Once | ORAL | Status: AC
  Administered 2023-10-05: 300 mg via ORAL

## 2023-10-05 MED ORDER — GLYCOPYRROLATE PF 0.2 MG/ML IJ SOSY
PREFILLED_SYRINGE | INTRAMUSCULAR | Status: AC
  Filled 2023-10-05: qty 1

## 2023-10-05 MED ORDER — ONDANSETRON HCL 4 MG PO TABS: 4.0000 mg | ORAL_TABLET | Freq: Three times a day (TID) | ORAL | 0 refills | Status: AC | PRN

## 2023-10-05 MED ORDER — OXYCODONE HCL 5 MG PO TABS
ORAL_TABLET | ORAL | Status: AC
  Filled 2023-10-05: qty 1

## 2023-10-05 MED ORDER — OXYCODONE HCL 5 MG/5ML PO SOLN: 5.0000 mg | Freq: Once | ORAL | Status: AC | PRN

## 2023-10-05 MED ORDER — BUPIVACAINE-EPINEPHRINE (PF) 0.5% -1:200000 IJ SOLN: INTRAMUSCULAR | Status: DC | PRN

## 2023-10-05 MED ORDER — BUPIVACAINE LIPOSOME 1.3 % IJ SUSP
INTRAMUSCULAR | Status: DC | PRN
  Administered 2023-10-05: 10 mL via PERINEURAL

## 2023-10-05 MED ORDER — HYDROMORPHONE HCL 1 MG/ML IJ SOLN
INTRAMUSCULAR | Status: AC
  Filled 2023-10-05: qty 0.5

## 2023-10-05 MED ORDER — MIDAZOLAM HCL 2 MG/2ML IJ SOLN
INTRAMUSCULAR | Status: AC
  Filled 2023-10-05: qty 2

## 2023-10-05 MED ORDER — MIDAZOLAM HCL 2 MG/2ML IJ SOLN
INTRAMUSCULAR | Status: AC
Start: 2023-10-05 — End: ?
  Filled 2023-10-05: qty 2

## 2023-10-05 MED ORDER — PHENYLEPHRINE 80 MCG/ML (10ML) SYRINGE FOR IV PUSH (FOR BLOOD PRESSURE SUPPORT)
PREFILLED_SYRINGE | INTRAVENOUS | Status: AC
  Filled 2023-10-05: qty 10

## 2023-10-05 MED ORDER — CEFAZOLIN SODIUM-DEXTROSE 2-4 GM/100ML-% IV SOLN
INTRAVENOUS | Status: AC
  Filled 2023-10-05: qty 100

## 2023-10-05 MED ORDER — GLYCOPYRROLATE PF 0.2 MG/ML IJ SOSY
PREFILLED_SYRINGE | INTRAMUSCULAR | Status: DC | PRN
  Administered 2023-10-05: .2 mg via INTRAVENOUS

## 2023-10-05 MED ORDER — DEXAMETHASONE SODIUM PHOSPHATE 10 MG/ML IJ SOLN
INTRAMUSCULAR | Status: DC | PRN
  Administered 2023-10-05: 8 mg via INTRAVENOUS

## 2023-10-05 MED ORDER — OXYCODONE HCL 5 MG PO TABS: ORAL_TABLET | ORAL | 0 refills | Status: AC

## 2023-10-05 MED ORDER — CEFAZOLIN SODIUM-DEXTROSE 2-4 GM/100ML-% IV SOLN
2.0000 g | INTRAVENOUS | Status: AC
  Administered 2023-10-05: 2 g via INTRAVENOUS

## 2023-10-05 MED ORDER — LACTATED RINGERS IV SOLN: INTRAVENOUS | Status: DC | PRN

## 2023-10-05 SURGICAL SUPPLY — 60 items
BIT DRILL 2.5X2.75 QC CALB (BIT) IMPLANT
BIT DRILL CALIBRATED 2.7 (BIT) IMPLANT
BLADE HEX COATED 2.75 (ELECTRODE) ×1 IMPLANT
BLADE SURG 10 STRL SS (BLADE) ×1 IMPLANT
BLADE SURG 15 STRL LF DISP TIS (BLADE) ×1 IMPLANT
BNDG ELASTIC 4INX 5YD STR LF (GAUZE/BANDAGES/DRESSINGS) ×1 IMPLANT
BNDG ESMARK 4X9 LF (GAUZE/BANDAGES/DRESSINGS) IMPLANT
CHLORAPREP W/TINT 26 (MISCELLANEOUS) ×1 IMPLANT
CLSR STERI-STRIP ANTIMIC 1/2X4 (GAUZE/BANDAGES/DRESSINGS) ×1 IMPLANT
CUFF TOURN SGL QUICK 18X3 (MISCELLANEOUS) ×1 IMPLANT
CUFF TRNQT CYL 24X4X16.5-23 (TOURNIQUET CUFF) IMPLANT
DRAPE C-ARM 42X72 X-RAY (DRAPES) IMPLANT
DRAPE C-ARMOR (DRAPES) IMPLANT
DRAPE EXTREMITY T 121X128X90 (DISPOSABLE) ×1 IMPLANT
DRAPE INCISE IOBAN 66X45 STRL (DRAPES) IMPLANT
DRAPE OEC MINIVIEW 54X84 (DRAPES) IMPLANT
DRAPE STERI 35X30 U-POUCH (DRAPES) IMPLANT
DRAPE U-SHAPE 47X51 STRL (DRAPES) ×1 IMPLANT
ELECTRODE BLDE 4.0 EZ CLN MEGD (MISCELLANEOUS) IMPLANT
ELECTRODE REM PT RTRN 9FT ADLT (ELECTROSURGICAL) ×1 IMPLANT
EXTENSION HOSE W/PLC CONNECTON (MISCELLANEOUS) IMPLANT
GAUZE SPONGE 4X4 12PLY STRL (GAUZE/BANDAGES/DRESSINGS) ×1 IMPLANT
GAUZE XEROFORM 1X8 LF (GAUZE/BANDAGES/DRESSINGS) IMPLANT
GLOVE BIO SURGEON STRL SZ 6.5 (GLOVE) ×1 IMPLANT
GLOVE BIOGEL PI IND STRL 6.5 (GLOVE) ×1 IMPLANT
GLOVE BIOGEL PI IND STRL 8 (GLOVE) ×1 IMPLANT
GLOVE ECLIPSE 8.0 STRL XLNG CF (GLOVE) ×1 IMPLANT
GOWN STRL REUS W/ TWL LRG LVL3 (GOWN DISPOSABLE) ×2 IMPLANT
GOWN STRL REUS W/TWL XL LVL3 (GOWN DISPOSABLE) ×1 IMPLANT
KWIRE FIXATION 2.0X6 (WIRE) IMPLANT
LOOP VASCLR MAXI BLUE 18IN ST (MISCELLANEOUS) IMPLANT
LOOPS VASCLR MAXI BLUE 18IN ST (MISCELLANEOUS) IMPLANT
NS IRRIG 1000ML POUR BTL (IV SOLUTION) ×1 IMPLANT
PACK ARTHROSCOPY DSU (CUSTOM PROCEDURE TRAY) ×1 IMPLANT
PACK BASIN DAY SURGERY FS (CUSTOM PROCEDURE TRAY) ×1 IMPLANT
PAD CAST 4YDX4 CTTN HI CHSV (CAST SUPPLIES) ×1 IMPLANT
PENCIL SMOKE EVACUATOR (MISCELLANEOUS) ×1 IMPLANT
PLATE LOCK LT LG 94X11 (Plate) IMPLANT
SCREW CORT T15 24X3.5XST LCK (Screw) IMPLANT
SCREW LOCK CORT STAR 3.5X12 (Screw) IMPLANT
SCREW LOCK CORT STAR 3.5X18 (Screw) IMPLANT
SCREW LOW PROFILE 22MMX3.5MM (Screw) IMPLANT
SHEET MEDIUM DRAPE 40X70 STRL (DRAPES) ×1 IMPLANT
SLEEVE SCD COMPRESS KNEE MED (STOCKING) IMPLANT
SLING ARM FOAM STRAP LRG (SOFTGOODS) ×1 IMPLANT
SPIKE FLUID TRANSFER (MISCELLANEOUS) IMPLANT
SPLINT PLASTER CAST FAST 5X30 (CAST SUPPLIES) ×10 IMPLANT
SPONGE T-LAP 18X18 ~~LOC~~+RFID (SPONGE) ×1 IMPLANT
SUCTION TUBE FRAZIER 10FR DISP (SUCTIONS) IMPLANT
SUT ETHILON 3 0 PS 1 (SUTURE) IMPLANT
SUT MNCRL AB 4-0 PS2 18 (SUTURE) IMPLANT
SUT VIC AB 0 CT1 27XBRD ANBCTR (SUTURE) ×1 IMPLANT
SUT VIC AB 1 CT1 27XBRD ANBCTR (SUTURE) IMPLANT
SUT VIC AB 2-0 CT1 TAPERPNT 27 (SUTURE) IMPLANT
SUT VIC AB 2-0 SH 27XBRD (SUTURE) ×1 IMPLANT
SUT VIC AB 3-0 SH 27X BRD (SUTURE) IMPLANT
TOWEL GREEN STERILE FF (TOWEL DISPOSABLE) ×1 IMPLANT
TUBE CONNECTING 20X1/4 (TUBING) ×1 IMPLANT
TUBE SUCTION HIGH CAP CLEAR NV (SUCTIONS) ×1 IMPLANT
WASHER 3.5MM (Orthopedic Implant) IMPLANT

## 2023-10-05 NOTE — Anesthesia Preprocedure Evaluation (Addendum)
 Anesthesia Evaluation  Patient identified by MRN, date of birth, ID band Patient awake    Reviewed: Allergy & Precautions, NPO status , Patient's Chart, lab work & pertinent test results  History of Anesthesia Complications Negative for: history of anesthetic complications  Airway Mallampati: II  TM Distance: >3 FB Neck ROM: Full    Dental  (+) Dental Advisory Given   Pulmonary Current Smoker and Patient abstained from smoking.   breath sounds clear to auscultation       Cardiovascular negative cardio ROS  Rhythm:Regular Rate:Normal     Neuro/Psych negative neurological ROS  negative psych ROS   GI/Hepatic negative GI ROS, Neg liver ROS,,,  Endo/Other  BMI 32.8  Renal/GU negative Renal ROS     Musculoskeletal   Abdominal   Peds  Hematology negative hematology ROS (+)   Anesthesia Other Findings   Reproductive/Obstetrics                             Anesthesia Physical Anesthesia Plan  ASA: 2  Anesthesia Plan: General   Post-op Pain Management: Regional block* and Tylenol  PO (pre-op)*   Induction: Intravenous  PONV Risk Score and Plan: 3 and Ondansetron , Dexamethasone  and Scopolamine  patch - Pre-op  Airway Management Planned: Oral ETT  Additional Equipment: None  Intra-op Plan:   Post-operative Plan: Extubation in OR  Informed Consent: I have reviewed the patients History and Physical, chart, labs and discussed the procedure including the risks, benefits and alternatives for the proposed anesthesia with the patient or authorized representative who has indicated his/her understanding and acceptance.     Dental advisory given  Plan Discussed with: CRNA and Surgeon  Anesthesia Plan Comments: (Plan routine monitors, GETA with Exparel  interscalene block for post op analgesia)       Anesthesia Quick Evaluation

## 2023-10-05 NOTE — Transfer of Care (Signed)
 Immediate Anesthesia Transfer of Care Note  Patient: Julia Mcknight  Procedure(s) Performed: OPEN REDUCTION INTERNAL FIXATION (ORIF) DISTAL HUMERUS FRACTURE (Left: Arm Upper)  Patient Location: PACU  Anesthesia Type:GA combined with regional for post-op pain  Level of Consciousness: drowsy and patient cooperative  Airway & Oxygen Therapy: Patient Spontanous Breathing and Patient connected to face mask oxygen  Post-op Assessment: Report given to RN and Post -op Vital signs reviewed and stable  Post vital signs: Reviewed and stable  Last Vitals:  Vitals Value Taken Time  BP 102/71 10/05/23 1145  Temp 36.1 C 10/05/23 1141  Pulse 89 10/05/23 1148  Resp 13 10/05/23 1148  SpO2 100 % 10/05/23 1148  Vitals shown include unfiled device data.  Last Pain:  Vitals:   10/05/23 1141  TempSrc:   PainSc: Asleep         Complications: No notable events documented.

## 2023-10-05 NOTE — Progress Notes (Signed)
Assisted Dr. Carswell Jackson with left, interscalene , ultrasound guided block. Side rails up, monitors on throughout procedure. See vital signs in flow sheet. Tolerated Procedure well. 

## 2023-10-05 NOTE — Anesthesia Postprocedure Evaluation (Signed)
 Anesthesia Post Note  Patient: Julia Mcknight  Procedure(s) Performed: OPEN REDUCTION INTERNAL FIXATION (ORIF) DISTAL HUMERUS FRACTURE (Left: Arm Upper)     Patient location during evaluation: PACU Anesthesia Type: General Level of consciousness: awake and alert, patient cooperative and oriented Pain management: pain level controlled Vital Signs Assessment: post-procedure vital signs reviewed and stable Respiratory status: spontaneous breathing, nonlabored ventilation and respiratory function stable Cardiovascular status: blood pressure returned to baseline and stable Postop Assessment: no apparent nausea or vomiting Anesthetic complications: no   No notable events documented.  Last Vitals:  Vitals:   10/05/23 1141 10/05/23 1152  BP: 100/69 107/76  Pulse: 85 86  Resp: 16 (!) 22  Temp: (!) 36.1 C   SpO2: 100% 100%    Last Pain:  Vitals:   10/05/23 1200  TempSrc:   PainSc: Asleep                 [place],E. Guy Toney

## 2023-10-05 NOTE — Op Note (Signed)
 Orthopaedic Surgery Operative Note (CSN: [id number])  Julia Mcknight  1995/07/21 Date of Surgery: 10/05/2023   Diagnoses:  Left short oblique supracondylar humerus fracture  Procedure: Left open reduction internal fixation of supracondylar humerus fracture with posterior approach and radial nerve neurolysis   Operative Finding Successful completion of the planned procedure.  Patient had a short oblique fracture that was not amenable to lag screw fixation.  There was a small amount of comminution.  We had a near anatomic reduction and great fixation with our plate.  Nylon suture used for closure.  Post-operative plan: The patient will be weightbearing less than 10 pounds with range of motion early.  The patient will be discharged home.  DVT prophylaxis not indicated in this ambulatory upper extremity patient without significant risk factors.   Pain control with PRN pain medication preferring oral medicines.  Follow up plan will be scheduled in approximately 7 days for incision check and XR.  Post-Op Diagnosis: Same Surgeons:Primary: Micheline Ahr, MD Assistants:Caroline McBane, PA-C and Rozanna Corner, RNFA Location: [facility] OR ROOM 6 Anesthesia: General with regional anesthesia Antibiotics: Ancef 2 g with local vancomycin powder 1 g at the surgical site Tourniquet time:  Estimated Blood Loss:50 Complications: None Specimens: None Implants: Implant Name Type Inv. Item Serial No. Manufacturer Lot No. LRB No. Used Action  PLATE LOCK LT LG [id number] - [id number] Plate PLATE LOCK LT LG [id number]  ZIMMER RECON(ORTH,TRAU,BIO,SG) ON SET Left 1 Implanted  WASHER 3.5MM - [id number] Orthopedic Implant WASHER 3.5MM  ZIMMER RECON(ORTH,TRAU,BIO,SG) ON SET Left 2 Implanted  SCREW CORT T15 24X3.5XST LCK - [id number] Screw SCREW CORT T15 24X3.5XST LCK  ZIMMER RECON(ORTH,TRAU,BIO,SG) ON SET Left 3 Implanted  SCREW LOW PROFILE 22MMX3. - [id number] Screw SCREW LOW PROFILE 22MMX3.  ZIMMER RECON(ORTH,TRAU,BIO,SG) ON  SET Left 2 Implanted  SCREW LOCK CORT STAR 3.5X18 - [id number] Screw SCREW LOCK CORT STAR 3.5X18  ZIMMER RECON(ORTH,TRAU,BIO,SG) ON SET Left 3 Implanted  SCREW LOCK CORT STAR 3.5X12 - [id number] Screw SCREW LOCK CORT STAR 3.5X12  ZIMMER RECON(ORTH,TRAU,BIO,SG) ON SET Left 1 Implanted    Indications for Surgery:   Julia Mcknight is a 28 y.o. female with injury resulting in a short oblique humerus fracture.  Benefits and risks of operative and nonoperative management were discussed prior to surgery with patient/guardian(s) and informed consent form was completed.  Specific risks including infection, need for additional surgery, neurovascular injury, nonunion, malunion, hardware pain amongst others.   Procedure:   The patient was identified properly. Informed consent was obtained and the surgical site was marked. The patient was taken up to suite where general anesthesia was induced.  The patient was positioned lateral on a beanbag with arm over sure if it.  The left humerus was prepped and draped in the usual sterile fashion.  Timeout was performed before the beginning of the case.  We began by marking our bony landmarks and made a posterior longitudinal excision over the triceps itself.  We identified the triceps fascia and open this.  The triceps tendon was identified distally and we stayed on the lateral aspect of the triceps using a pair tricipital approach throughout the case.  This was a short oblique fracture that was not amenable to lag screw fixation.   We identified the neurovascular bundle in the form of the radial nerve and radial artery neurolysed these from the lateral septum around the posterior aspect of the humerus placing a Penrose drain with a tag stitch to identify this bundle later and mobilize it.  The  radial nerve and artery were protected with a blunt retractor throughout this.   We then used fluoroscopy to select an [employer] extra-articular distal humerus plate and centered  on the posterior aspect of the humerus and on the lateral column of the distal humerus.  The plate was carefully slid underneath the bundle to avoid compression of the structure.  The bundle in its final position laid between 2/3 hole when counting from the proximal aspect of the plate.  Plate fixed with non locking screws 4 proximal to fracture and a combination of 4 locking and non-locking screws distally.   We had great fixation at the end of the case and took final fluoroscopic images prior to irrigating copiously.  We then placed local vancomycin powder.   The lateral paratriciptal window was closed taking care to avoid binding the neurovascular bundle still closing dead space.    We irrigated the wound copiously before placing local antibiotic as listed above.  We closed the incision in a multilayer fashion with absorbable suture.  Sterile dressing was placed.  Patient was awoken taken to PACU in stable condition.  Nicholas Bari, PA-C, present and scrubbed throughout the case, critical for completion in a timely fashion, and for retraction, instrumentation, closure.

## 2023-10-05 NOTE — Interval H&P Note (Signed)
 All questions answered, patient wants to proceed with procedure. ? ?

## 2023-10-05 NOTE — Anesthesia Procedure Notes (Signed)
 Anesthesia Regional Block: Interscalene brachial plexus block   Pre-Anesthetic Checklist: , timeout performed,  Correct Patient, Correct Site, Correct Laterality,  Correct Procedure, Correct Position, site marked,  Risks and benefits discussed,  Surgical consent,  Pre-op evaluation,  At surgeon's request and post-op pain management  Laterality: Left and Upper  Prep: chloraprep       Needles:  Injection technique: Single-shot  Needle Type: Echogenic Needle     Needle Length: 9cm  Needle Gauge: 21     Additional Needles:   Procedures:,,,, ultrasound used (permanent image in chart),,    Narrative:  Start time: 10/05/2023 9:26 AM End time: 10/05/2023 9:32 AM Injection made incrementally with aspirations every 5 mL.  Performed by: Personally  Anesthesiologist: Jonne Netters, MD  Additional Notes: Pt identified in Holding room.  Monitors applied. Working IV access confirmed. Timeout, Sterile prep L clavicle and neck.  #21ga ECHOgenic Arrow block needle to interscalene brachial plexus with US  guidance.  10cc 0.5% Bupivacaine  1:200k epi, Exparel  injected incrementally after negative test dose.  Patient asymptomatic, VSS, no heme aspirated, tolerated well.   Fay Hoop, MD

## 2023-10-05 NOTE — Anesthesia Procedure Notes (Signed)
 Procedure Name: Intubation Date/Time: 10/05/2023 10:07 AM  Performed by: Darcel Early, CRNAPre-anesthesia Checklist: Patient identified, Emergency Drugs available, Suction available and Patient being monitored Patient Re-evaluated:Patient Re-evaluated prior to induction Oxygen Delivery Method: Circle system utilized Preoxygenation: Pre-oxygenation with 100% oxygen Induction Type: Combination inhalational/ intravenous induction and IV induction Ventilation: Mask ventilation without difficulty Laryngoscope Size: Mac and 3 Grade View: Grade I Tube type: Oral Tube size: 7.0 mm Number of attempts: 1 Airway Equipment and Method: Stylet Placement Confirmation: ETT inserted through vocal cords under direct vision, positive ETCO2 and breath sounds checked- equal and bilateral Secured at: 22 cm Tube secured with: Tape Dental Injury: Teeth and Oropharynx as per pre-operative assessment

## 2023-10-06 ENCOUNTER — Encounter (HOSPITAL_BASED_OUTPATIENT_CLINIC_OR_DEPARTMENT_OTHER): Payer: Self-pay | Admitting: Orthopaedic Surgery
